# Patient Record
Sex: Male | Born: 1935 | Race: White | Hispanic: No | Marital: Married | Smoking: Former smoker
Health system: Southern US, Community
[De-identification: ages and names within clinical notes are randomized; demographics above are authoritative.]

## PROBLEM LIST (undated history)

## (undated) DIAGNOSIS — D649 Anemia, unspecified: Secondary | ICD-10-CM

## (undated) DIAGNOSIS — F419 Anxiety disorder, unspecified: Secondary | ICD-10-CM

## (undated) DIAGNOSIS — F0280 Dementia in other diseases classified elsewhere without behavioral disturbance: Secondary | ICD-10-CM

## (undated) DIAGNOSIS — E559 Vitamin D deficiency, unspecified: Secondary | ICD-10-CM

## (undated) DIAGNOSIS — G309 Alzheimer's disease, unspecified: Secondary | ICD-10-CM

## (undated) DIAGNOSIS — N189 Chronic kidney disease, unspecified: Secondary | ICD-10-CM

---

## 2007-07-20 ENCOUNTER — Ambulatory Visit: Payer: Self-pay | Admitting: Internal Medicine

## 2007-11-07 ENCOUNTER — Ambulatory Visit: Payer: Self-pay | Admitting: Emergency Medicine

## 2008-01-31 ENCOUNTER — Ambulatory Visit: Payer: Self-pay | Admitting: Family Medicine

## 2008-04-03 ENCOUNTER — Ambulatory Visit: Payer: Self-pay | Admitting: Internal Medicine

## 2008-05-15 ENCOUNTER — Ambulatory Visit: Payer: Self-pay | Admitting: Family Medicine

## 2009-01-31 ENCOUNTER — Ambulatory Visit: Payer: Self-pay | Admitting: Internal Medicine

## 2009-05-15 ENCOUNTER — Ambulatory Visit: Payer: Self-pay | Admitting: Internal Medicine

## 2009-07-14 ENCOUNTER — Ambulatory Visit: Payer: Self-pay | Admitting: Internal Medicine

## 2009-10-24 ENCOUNTER — Ambulatory Visit: Payer: Self-pay | Admitting: Internal Medicine

## 2010-02-24 ENCOUNTER — Ambulatory Visit: Payer: Self-pay | Admitting: Internal Medicine

## 2010-05-15 ENCOUNTER — Ambulatory Visit: Payer: Self-pay | Admitting: Internal Medicine

## 2010-07-12 ENCOUNTER — Ambulatory Visit: Payer: Self-pay | Admitting: Internal Medicine

## 2010-09-25 ENCOUNTER — Ambulatory Visit: Payer: Self-pay | Admitting: Family Medicine

## 2010-11-24 ENCOUNTER — Ambulatory Visit: Payer: Self-pay | Admitting: Internal Medicine

## 2011-02-01 ENCOUNTER — Ambulatory Visit: Payer: Self-pay | Admitting: Family Medicine

## 2011-08-10 ENCOUNTER — Emergency Department: Payer: Self-pay | Admitting: Unknown Physician Specialty

## 2012-08-19 ENCOUNTER — Ambulatory Visit: Payer: Self-pay

## 2013-03-23 ENCOUNTER — Ambulatory Visit: Payer: Self-pay

## 2013-03-23 LAB — CBC WITH DIFFERENTIAL/PLATELET
Basophil %: 0.5 %
Eosinophil %: 1 %
HGB: 14 g/dL (ref 13.0–18.0)
Lymphocyte #: 1.5 10*3/uL (ref 1.0–3.6)
MCH: 30.8 pg (ref 26.0–34.0)
MCV: 87 fL (ref 80–100)
Monocyte #: 0.5 x10 3/mm (ref 0.2–1.0)
Monocyte %: 6.9 %
Neutrophil #: 5.6 10*3/uL (ref 1.4–6.5)
Neutrophil %: 72.4 %
Platelet: 224 10*3/uL (ref 150–440)
RBC: 4.54 10*6/uL (ref 4.40–5.90)

## 2013-03-23 LAB — COMPREHENSIVE METABOLIC PANEL
Albumin: 4.4 g/dL (ref 3.4–5.0)
Alkaline Phosphatase: 75 U/L (ref 50–136)
Anion Gap: 7 (ref 7–16)
Bilirubin,Total: 0.6 mg/dL (ref 0.2–1.0)
Calcium, Total: 9.1 mg/dL (ref 8.5–10.1)
Co2: 31 mmol/L (ref 21–32)
EGFR (Non-African Amer.): 38 — ABNORMAL LOW
Osmolality: 289 (ref 275–301)
Potassium: 4.3 mmol/L (ref 3.5–5.1)
SGOT(AST): 15 U/L (ref 15–37)
SGPT (ALT): 15 U/L (ref 12–78)
Total Protein: 7.7 g/dL (ref 6.4–8.2)

## 2013-03-23 LAB — URINALYSIS, COMPLETE
Glucose,UR: NEGATIVE mg/dL (ref 0–75)
Nitrite: POSITIVE

## 2013-07-18 ENCOUNTER — Emergency Department: Payer: Self-pay | Admitting: Emergency Medicine

## 2013-07-18 LAB — URINALYSIS, COMPLETE
Bilirubin,UR: NEGATIVE
Nitrite: NEGATIVE
Ph: 6 (ref 4.5–8.0)
Protein: NEGATIVE
Specific Gravity: 1.012 (ref 1.003–1.030)
WBC UR: 173 /HPF (ref 0–5)

## 2013-07-18 LAB — COMPREHENSIVE METABOLIC PANEL
Albumin: 3.6 g/dL (ref 3.4–5.0)
Alkaline Phosphatase: 75 U/L (ref 50–136)
Anion Gap: 3 — ABNORMAL LOW (ref 7–16)
Bilirubin,Total: 0.4 mg/dL (ref 0.2–1.0)
Calcium, Total: 8.7 mg/dL (ref 8.5–10.1)
Chloride: 109 mmol/L — ABNORMAL HIGH (ref 98–107)
Co2: 30 mmol/L (ref 21–32)
Creatinine: 1.61 mg/dL — ABNORMAL HIGH (ref 0.60–1.30)
EGFR (Non-African Amer.): 39 — ABNORMAL LOW
Glucose: 111 mg/dL — ABNORMAL HIGH (ref 65–99)
Osmolality: 286 (ref 275–301)
SGOT(AST): 20 U/L (ref 15–37)

## 2013-07-18 LAB — CBC
HCT: 39.2 % — ABNORMAL LOW (ref 40.0–52.0)
HGB: 13.8 g/dL (ref 13.0–18.0)
MCHC: 35.1 g/dL (ref 32.0–36.0)
Platelet: 172 10*3/uL (ref 150–440)
RBC: 4.37 10*6/uL — ABNORMAL LOW (ref 4.40–5.90)
WBC: 5.6 10*3/uL (ref 3.8–10.6)

## 2013-07-20 LAB — URINE CULTURE

## 2013-09-21 ENCOUNTER — Ambulatory Visit: Payer: Self-pay

## 2013-09-21 DIAGNOSIS — R0989 Other specified symptoms and signs involving the circulatory and respiratory systems: Secondary | ICD-10-CM

## 2013-10-08 ENCOUNTER — Ambulatory Visit: Payer: Self-pay | Admitting: Family Medicine

## 2014-02-28 ENCOUNTER — Emergency Department: Payer: Self-pay | Admitting: Internal Medicine

## 2014-02-28 LAB — COMPREHENSIVE METABOLIC PANEL
ALBUMIN: 3.4 g/dL (ref 3.4–5.0)
ALT: 9 U/L — AB (ref 12–78)
AST: 13 U/L — AB (ref 15–37)
Alkaline Phosphatase: 73 U/L
Anion Gap: 6 — ABNORMAL LOW (ref 7–16)
BUN: 19 mg/dL — AB (ref 7–18)
Bilirubin,Total: 0.6 mg/dL (ref 0.2–1.0)
CREATININE: 1.42 mg/dL — AB (ref 0.60–1.30)
Calcium, Total: 8.4 mg/dL — ABNORMAL LOW (ref 8.5–10.1)
Chloride: 106 mmol/L (ref 98–107)
Co2: 28 mmol/L (ref 21–32)
EGFR (African American): 53 — ABNORMAL LOW
EGFR (Non-African Amer.): 45 — ABNORMAL LOW
Glucose: 87 mg/dL (ref 65–99)
OSMOLALITY: 281 (ref 275–301)
Potassium: 4 mmol/L (ref 3.5–5.1)
Sodium: 140 mmol/L (ref 136–145)
Total Protein: 6.3 g/dL — ABNORMAL LOW (ref 6.4–8.2)

## 2014-02-28 LAB — URINALYSIS, COMPLETE
BLOOD: NEGATIVE
Bilirubin,UR: NEGATIVE
Glucose,UR: NEGATIVE mg/dL (ref 0–75)
Hyaline Cast: 5
Ketone: NEGATIVE
Nitrite: POSITIVE
Ph: 7 (ref 4.5–8.0)
Protein: NEGATIVE
RBC,UR: 9 /HPF (ref 0–5)
SPECIFIC GRAVITY: 1.014 (ref 1.003–1.030)
SQUAMOUS EPITHELIAL: NONE SEEN

## 2014-02-28 LAB — CBC
HCT: 35.7 % — ABNORMAL LOW (ref 40.0–52.0)
HGB: 12.1 g/dL — ABNORMAL LOW (ref 13.0–18.0)
MCH: 30.9 pg (ref 26.0–34.0)
MCHC: 34 g/dL (ref 32.0–36.0)
MCV: 91 fL (ref 80–100)
Platelet: 208 10*3/uL (ref 150–440)
RBC: 3.92 10*6/uL — ABNORMAL LOW (ref 4.40–5.90)
RDW: 13.5 % (ref 11.5–14.5)
WBC: 6.2 10*3/uL (ref 3.8–10.6)

## 2014-02-28 LAB — TROPONIN I: Troponin-I: <0.02 ng/mL

## 2014-05-11 ENCOUNTER — Ambulatory Visit: Payer: Self-pay | Admitting: Obstetrics and Gynecology

## 2014-06-17 ENCOUNTER — Emergency Department: Payer: Self-pay | Admitting: Emergency Medicine

## 2014-06-17 LAB — CBC
HCT: 38.7 % — ABNORMAL LOW (ref 40.0–52.0)
HGB: 12.8 g/dL — ABNORMAL LOW (ref 13.0–18.0)
MCH: 30.3 pg (ref 26.0–34.0)
MCHC: 33.1 g/dL (ref 32.0–36.0)
MCV: 92 fL (ref 80–100)
PLATELETS: 221 10*3/uL (ref 150–440)
RBC: 4.23 10*6/uL — AB (ref 4.40–5.90)
RDW: 14.1 % (ref 11.5–14.5)
WBC: 5.9 10*3/uL (ref 3.8–10.6)

## 2014-06-17 LAB — BASIC METABOLIC PANEL
Anion Gap: 6 — ABNORMAL LOW (ref 7–16)
BUN: 19 mg/dL — AB (ref 7–18)
CHLORIDE: 109 mmol/L — AB (ref 98–107)
CO2: 27 mmol/L (ref 21–32)
Calcium, Total: 8.5 mg/dL (ref 8.5–10.1)
Creatinine: 1.49 mg/dL — ABNORMAL HIGH (ref 0.60–1.30)
EGFR (African American): 58 — ABNORMAL LOW
EGFR (Non-African Amer.): 48 — ABNORMAL LOW
GLUCOSE: 93 mg/dL (ref 65–99)
OSMOLALITY: 285 (ref 275–301)
POTASSIUM: 3.7 mmol/L (ref 3.5–5.1)
Sodium: 142 mmol/L (ref 136–145)

## 2014-06-17 LAB — URINALYSIS, COMPLETE
BLOOD: NEGATIVE
Bacteria: NONE SEEN
Bilirubin,UR: NEGATIVE
Glucose,UR: NEGATIVE mg/dL (ref 0–75)
Ketone: NEGATIVE
NITRITE: NEGATIVE
PROTEIN: NEGATIVE
Ph: 6 (ref 4.5–8.0)
RBC,UR: 7 /HPF (ref 0–5)
SPECIFIC GRAVITY: 1.019 (ref 1.003–1.030)
Squamous Epithelial: NONE SEEN
WBC UR: 63 /HPF (ref 0–5)

## 2014-06-17 LAB — TROPONIN I: Troponin-I: <0.02 ng/mL

## 2015-05-20 ENCOUNTER — Ambulatory Visit: Payer: Self-pay | Admitting: Obstetrics and Gynecology

## 2016-09-10 ENCOUNTER — Emergency Department: Payer: Medicare Other

## 2016-09-10 ENCOUNTER — Encounter: Payer: Self-pay | Admitting: Emergency Medicine

## 2016-09-10 ENCOUNTER — Emergency Department
Admission: EM | Admit: 2016-09-10 | Discharge: 2016-09-10 | Disposition: A | Discharge status: Skilled Nursing Facility | Payer: Medicare Other | Attending: Emergency Medicine | Admitting: Emergency Medicine

## 2016-09-10 DIAGNOSIS — Y939 Activity, unspecified: Secondary | ICD-10-CM | POA: Insufficient documentation

## 2016-09-10 DIAGNOSIS — R05 Cough: Secondary | ICD-10-CM | POA: Diagnosis not present

## 2016-09-10 DIAGNOSIS — S61412A Laceration without foreign body of left hand, initial encounter: Secondary | ICD-10-CM

## 2016-09-10 DIAGNOSIS — Y929 Unspecified place or not applicable: Secondary | ICD-10-CM | POA: Insufficient documentation

## 2016-09-10 DIAGNOSIS — Y999 Unspecified external cause status: Secondary | ICD-10-CM | POA: Insufficient documentation

## 2016-09-10 DIAGNOSIS — X58XXXA Exposure to other specified factors, initial encounter: Secondary | ICD-10-CM | POA: Diagnosis not present

## 2016-09-10 DIAGNOSIS — Z87891 Personal history of nicotine dependence: Secondary | ICD-10-CM | POA: Diagnosis not present

## 2016-09-10 NOTE — Discharge Instructions (Signed)
Please seek medical attention for any high fevers, chest pain, shortness of breath, change in behavior, persistent vomiting, bloody stool or any other new or concerning symptoms.  

## 2016-09-10 NOTE — ED Provider Notes (Signed)
[facility] Emergency Department Provider Note    ____________________________________________   I have reviewed the triage vital signs and the nursing notes.   HISTORY  Chief Complaint Abrasion   History limited by: Dementia   HPI Kyle Horne is a 82 y.o. male who presents to the emergency department today via EMS because of living facility staff concern for skin tear over dorsam of left hand. This occurred today. Unclear etiology. The patient himself is unable to give any history. Talked to the wife over the phone who states she saw him earlier today and other than a cough he was acting at his baseline. Patient is not complaining of any pain.   History reviewed. No pertinent past medical history.  There are no active problems to display for this patient.   History reviewed. No pertinent surgical history.  Prior to Admission medications   Not on File    Allergies Patient has no known allergies.  History reviewed. No pertinent family history.  Social History Social History  Substance Use Topics  . Smoking status: Former Games developermoker  . Smokeless tobacco: Never Used  . Alcohol use No    Review of Systems  Constitutional: Negative for fever. Cardiovascular: Negative for chest pain. Respiratory: Negative for shortness of breath. Gastrointestinal: Negative for abdominal pain, vomiting and diarrhea. Genitourinary: Negative for dysuria. Musculoskeletal: Negative for back pain. Skin: Skin tear over left hand. Neurological: Negative for headaches, focal weakness or numbness.  10-point ROS otherwise negative.  ____________________________________________   PHYSICAL EXAM:  VITAL SIGNS: ED Triage Vitals  Enc Vitals Group     BP 09/10/16 1750 (!) 141/78     Pulse Rate 09/10/16 1750 60     Resp --      Temp 09/10/16 1750 97.6 F (36.4 C)     Temp Source 09/10/16 1750 Oral     SpO2 09/10/16 1750 100 %     Weight 09/10/16 1751 164  lb (74.4 kg)   Constitutional: Awake, alert, no concerning findings. Eyes: Conjunctivae are normal. Normal extraocular movements. ENT   Head: Normocephalic and atraumatic.   Nose: No congestion/rhinnorhea.   Mouth/Throat: Mucous membranes are moist.   Neck: No stridor. Hematological/Lymphatic/Immunilogical: No cervical lymphadenopathy. Cardiovascular: Normal rate, regular rhythm.  No murmurs, rubs, or gallops.  Respiratory: Normal respiratory effort without tachypnea nor retractions. Breath sounds are clear and equal bilaterally. No wheezes/rales/rhonchi. Gastrointestinal: Soft and non tender. No rebound. No guarding.  Genitourinary: Deferred Musculoskeletal: Normal range of motion in all extremities. No lower extremity edema. Neurologic:  Not oriented. No gross focal neurologic deficits are appreciated.  Skin:  Skin is warm, dry and intact. No rash noted. Psychiatric: Mood and affect are normal. Speech and behavior are normal. Patient exhibits appropriate insight and judgment.  ____________________________________________    LABS (pertinent positives/negatives)  None  ____________________________________________   EKG  None  ____________________________________________    RADIOLOGY  CXR IMPRESSION:  No acute abnormality seen.   ____________________________________________   PROCEDURES  Procedures  ____________________________________________   INITIAL IMPRESSION / ASSESSMENT AND PLAN / ED COURSE  Pertinent labs & imaging results that were available during my care of the patient were reviewed by me and considered in my medical decision making (see chart for details).  Patient presents to the emergency department today because of concern for skin tear by living facility staff. Wife also stated he has had a cough. Wife did not feel any blood work needed to be performed. Chest x-ray was normal. Hand was  dressed.  ____________________________________________   FINAL CLINICAL IMPRESSION(S) / ED DIAGNOSES  Final diagnoses:  Skin tear of left hand without complication, initial encounter     Note: This dictation was prepared with Dragon dictation. Any transcriptional errors that result from this process are unintentional     Phineas SemenGraydon Mccrae Speciale, MD 09/10/16 1902

## 2016-09-10 NOTE — ED Notes (Signed)
RN called [facility], spoke with Marchelle FolksAmanda: Reviewed d/c instructions, follow-up care, and wound/dressing care. Caregiver verbalized understanding

## 2016-09-10 NOTE — ED Triage Notes (Signed)
[facility] for left hand skin tear. Wound not bleeding at this time.

## 2016-12-08 ENCOUNTER — Inpatient Hospital Stay
Admission: EM | Admit: 2016-12-08 | Discharge: 2016-12-12 | DRG: 194 | Disposition: A | Discharge status: Skilled Nursing Facility | Payer: Medicare Other | Attending: Specialist | Admitting: Specialist

## 2016-12-08 ENCOUNTER — Emergency Department: Payer: Medicare Other

## 2016-12-08 ENCOUNTER — Encounter: Payer: Self-pay | Admitting: Emergency Medicine

## 2016-12-08 DIAGNOSIS — F0281 Dementia in other diseases classified elsewhere with behavioral disturbance: Secondary | ICD-10-CM | POA: Diagnosis present

## 2016-12-08 DIAGNOSIS — J101 Influenza due to other identified influenza virus with other respiratory manifestations: Secondary | ICD-10-CM | POA: Diagnosis present

## 2016-12-08 DIAGNOSIS — Z87891 Personal history of nicotine dependence: Secondary | ICD-10-CM

## 2016-12-08 DIAGNOSIS — N179 Acute kidney failure, unspecified: Secondary | ICD-10-CM | POA: Diagnosis present

## 2016-12-08 DIAGNOSIS — D649 Anemia, unspecified: Secondary | ICD-10-CM | POA: Diagnosis present

## 2016-12-08 DIAGNOSIS — Y95 Nosocomial condition: Secondary | ICD-10-CM | POA: Diagnosis present

## 2016-12-08 DIAGNOSIS — N400 Enlarged prostate without lower urinary tract symptoms: Secondary | ICD-10-CM | POA: Diagnosis present

## 2016-12-08 DIAGNOSIS — J189 Pneumonia, unspecified organism: Secondary | ICD-10-CM | POA: Diagnosis present

## 2016-12-08 DIAGNOSIS — N189 Chronic kidney disease, unspecified: Secondary | ICD-10-CM | POA: Diagnosis present

## 2016-12-08 DIAGNOSIS — J111 Influenza due to unidentified influenza virus with other respiratory manifestations: Secondary | ICD-10-CM | POA: Diagnosis not present

## 2016-12-08 DIAGNOSIS — J1008 Influenza due to other identified influenza virus with other specified pneumonia: Principal | ICD-10-CM | POA: Diagnosis present

## 2016-12-08 DIAGNOSIS — J690 Pneumonitis due to inhalation of food and vomit: Secondary | ICD-10-CM | POA: Diagnosis present

## 2016-12-08 DIAGNOSIS — E860 Dehydration: Secondary | ICD-10-CM | POA: Diagnosis present

## 2016-12-08 DIAGNOSIS — E559 Vitamin D deficiency, unspecified: Secondary | ICD-10-CM | POA: Diagnosis present

## 2016-12-08 DIAGNOSIS — Z79899 Other long term (current) drug therapy: Secondary | ICD-10-CM

## 2016-12-08 DIAGNOSIS — G309 Alzheimer's disease, unspecified: Secondary | ICD-10-CM | POA: Diagnosis present

## 2016-12-08 DIAGNOSIS — J181 Lobar pneumonia, unspecified organism: Secondary | ICD-10-CM

## 2016-12-08 DIAGNOSIS — F419 Anxiety disorder, unspecified: Secondary | ICD-10-CM | POA: Diagnosis present

## 2016-12-08 DIAGNOSIS — R0682 Tachypnea, not elsewhere classified: Secondary | ICD-10-CM | POA: Diagnosis present

## 2016-12-08 DIAGNOSIS — R509 Fever, unspecified: Secondary | ICD-10-CM

## 2016-12-08 HISTORY — DX: Anemia, unspecified: D64.9

## 2016-12-08 HISTORY — DX: Anxiety disorder, unspecified: F41.9

## 2016-12-08 HISTORY — DX: Alzheimer's disease, unspecified: G30.9

## 2016-12-08 HISTORY — DX: Vitamin D deficiency, unspecified: E55.9

## 2016-12-08 HISTORY — DX: Chronic kidney disease, unspecified: N18.9

## 2016-12-08 HISTORY — DX: Dementia in other diseases classified elsewhere, unspecified severity, without behavioral disturbance, psychotic disturbance, mood disturbance, and anxiety: F02.80

## 2016-12-08 LAB — CBC WITH DIFFERENTIAL/PLATELET
Basophils Absolute: 0.1 10*3/uL (ref 0–0.1)
Basophils Relative: 1 %
EOS PCT: 0 %
Eosinophils Absolute: 0 10*3/uL (ref 0–0.7)
HCT: 34.7 % — ABNORMAL LOW (ref 40.0–52.0)
HEMOGLOBIN: 12.2 g/dL — AB (ref 13.0–18.0)
LYMPHS ABS: 0.5 10*3/uL — AB (ref 1.0–3.6)
LYMPHS PCT: 6 %
MCH: 31.6 pg (ref 26.0–34.0)
MCHC: 35.3 g/dL (ref 32.0–36.0)
MCV: 89.7 fL (ref 80.0–100.0)
MONOS PCT: 6 %
Monocytes Absolute: 0.6 10*3/uL (ref 0.2–1.0)
Neutro Abs: 7.9 10*3/uL — ABNORMAL HIGH (ref 1.4–6.5)
Neutrophils Relative %: 87 %
Platelets: 158 10*3/uL (ref 150–440)
RBC: 3.86 MIL/uL — AB (ref 4.40–5.90)
RDW: 13.7 % (ref 11.5–14.5)
WBC: 9.1 10*3/uL (ref 3.8–10.6)

## 2016-12-08 LAB — COMPREHENSIVE METABOLIC PANEL
ALK PHOS: 53 U/L (ref 38–126)
ALT: 26 U/L (ref 17–63)
AST: 34 U/L (ref 15–41)
Albumin: 3.7 g/dL (ref 3.5–5.0)
Anion gap: 9 (ref 5–15)
BUN: 29 mg/dL — ABNORMAL HIGH (ref 6–20)
CO2: 24 mmol/L (ref 22–32)
CREATININE: 1.5 mg/dL — AB (ref 0.61–1.24)
Calcium: 8.5 mg/dL — ABNORMAL LOW (ref 8.9–10.3)
Chloride: 109 mmol/L (ref 101–111)
GFR, EST AFRICAN AMERICAN: 47 mL/min — AB (ref >60)
GFR, EST NON AFRICAN AMERICAN: 40 mL/min — AB (ref >60)
Glucose, Bld: 124 mg/dL — ABNORMAL HIGH (ref 65–99)
Potassium: 3.8 mmol/L (ref 3.5–5.1)
Sodium: 142 mmol/L (ref 135–145)
TOTAL PROTEIN: 7.1 g/dL (ref 6.5–8.1)
Total Bilirubin: 0.9 mg/dL (ref 0.3–1.2)

## 2016-12-08 LAB — URINALYSIS, ROUTINE W REFLEX MICROSCOPIC
Bilirubin Urine: NEGATIVE
Glucose, UA: NEGATIVE mg/dL
HGB URINE DIPSTICK: NEGATIVE
Ketones, ur: NEGATIVE mg/dL
LEUKOCYTES UA: NEGATIVE
Nitrite: NEGATIVE
PROTEIN: NEGATIVE mg/dL
SPECIFIC GRAVITY, URINE: 1.01 (ref 1.005–1.030)
pH: 6 (ref 5.0–8.0)

## 2016-12-08 LAB — INFLUENZA PANEL BY PCR (TYPE A & B)
INFLAPCR: NEGATIVE
INFLBPCR: POSITIVE — AB

## 2016-12-08 LAB — TROPONIN I: Troponin I: <0.03 ng/mL (ref <0.03)

## 2016-12-08 LAB — PROCALCITONIN

## 2016-12-08 LAB — PROTIME-INR
INR: 1.13
Prothrombin Time: 14.6 seconds (ref 11.4–15.2)

## 2016-12-08 LAB — LIPASE, BLOOD: LIPASE: 17 U/L (ref 11–51)

## 2016-12-08 LAB — LACTIC ACID, PLASMA: Lactic Acid, Venous: 1.3 mmol/L (ref 0.5–1.9)

## 2016-12-08 MED ORDER — AZITHROMYCIN 500 MG IV SOLR
500.0000 mg | Freq: Once | INTRAVENOUS | Status: AC
  Administered 2016-12-08: 500 mg via INTRAVENOUS
  Filled 2016-12-08: qty 500

## 2016-12-08 MED ORDER — IPRATROPIUM-ALBUTEROL 0.5-2.5 (3) MG/3ML IN SOLN
3.0000 mL | Freq: Once | RESPIRATORY_TRACT | Status: AC
  Administered 2016-12-08: 3 mL via RESPIRATORY_TRACT
  Filled 2016-12-08: qty 3

## 2016-12-08 MED ORDER — SODIUM CHLORIDE 0.9 % IV BOLUS (SEPSIS)
1000.0000 mL | Freq: Once | INTRAVENOUS | Status: AC
  Administered 2016-12-08: 1000 mL via INTRAVENOUS

## 2016-12-08 MED ORDER — CEFTRIAXONE SODIUM-DEXTROSE 1-3.74 GM-% IV SOLR
1.0000 g | Freq: Once | INTRAVENOUS | Status: AC
  Administered 2016-12-08: 1 g via INTRAVENOUS
  Filled 2016-12-08: qty 50

## 2016-12-08 MED ORDER — IBUPROFEN 100 MG/5ML PO SUSP
ORAL | Status: AC
  Administered 2016-12-08: 400 mg via ORAL
  Filled 2016-12-08: qty 20

## 2016-12-08 MED ORDER — ACETAMINOPHEN 160 MG/5ML PO SUSP
ORAL | Status: AC
  Administered 2016-12-08: 1000 mg via ORAL
  Filled 2016-12-08: qty 35

## 2016-12-08 MED ORDER — CEFTRIAXONE SODIUM 1 G IJ SOLR: 1.0000 g | Freq: Once | INTRAMUSCULAR | Status: DC

## 2016-12-08 MED ORDER — IBUPROFEN 100 MG/5ML PO SUSP
400.0000 mg | Freq: Once | ORAL | Status: AC
  Administered 2016-12-08: 400 mg via ORAL

## 2016-12-08 MED ORDER — OSELTAMIVIR PHOSPHATE 30 MG PO CAPS
30.0000 mg | ORAL_CAPSULE | Freq: Once | ORAL | Status: AC
  Administered 2016-12-08: 30 mg via ORAL
  Filled 2016-12-08: qty 1

## 2016-12-08 MED ORDER — ACETAMINOPHEN 160 MG/5ML PO SOLN
1000.0000 mg | Freq: Once | ORAL | Status: AC
  Administered 2016-12-08: 1000 mg via ORAL
  Filled 2016-12-08: qty 40

## 2016-12-08 MED ORDER — CLINDAMYCIN PHOSPHATE 900 MG/50ML IV SOLN
900.0000 mg | Freq: Once | INTRAVENOUS | Status: AC
  Administered 2016-12-08: 900 mg via INTRAVENOUS
  Filled 2016-12-08: qty 50

## 2016-12-08 NOTE — ED Triage Notes (Signed)
[facility] to ED via [facility], per facility pt has increased weakness and decrease PO intake x3days. Pt hx of dementia. Pt fever 101 HR 80, 96% on RA, 146/78. Per EMS pt is  Incontinent.

## 2016-12-08 NOTE — ED Notes (Signed)
Pt family left requesting sitter. Charge nurse made aware. RN at bedside at this time to monitor pt.

## 2016-12-08 NOTE — ED Provider Notes (Signed)
[facility] Emergency Department Provider Note  ____________________________________________   None    (approximate)  I have reviewed the triage vital signs and the nursing notes.   HISTORY  Chief Complaint Weakness and Fever    HPI Kyle Horne is a 83 y.o. male who comes to the emergency department via EMS for fever and worsening altered mental status today. He resides in a nursing home in his diaper dependent. According to EMS he was febrile to 102 en route. He is full code. He has a history of Alzheimer's dementia. He is also had a slightly productive cough for the past several days. Further history is limited by the patient's dementia.   Past Medical History:  Diagnosis Date  . Alzheimer disease   . Anemia   . Anxiety disorder   . CKD (chronic kidney disease)   . Vitamin D deficiency     Patient Active Problem List   Diagnosis Date Noted  . HCAP (healthcare-associated pneumonia) 12/09/2016    History reviewed. No pertinent surgical history.  Prior to Admission medications   Medication Sig Start Date End Date Taking? Authorizing Provider  acetaminophen (TYLENOL) 500 MG tablet Take 500 mg by mouth 3 (three) times daily.    Yes Historical Provider, MD  ALPRAZolam Prudy Feeler) 0.5 MG tablet Take 0.5 mg by mouth at bedtime.   Yes Historical Provider, MD  divalproex (DEPAKOTE SPRINKLE) 125 MG capsule Take 250 mg by mouth 3 (three) times daily.   Yes Historical Provider, MD  finasteride (PROSCAR) 5 MG tablet Take 5 mg by mouth daily.   Yes Historical Provider, MD  furosemide (LASIX) 40 MG tablet Take 40 mg by mouth daily.   Yes Historical Provider, MD  Melatonin 5 MG TABS Take 1 tablet by mouth at bedtime.   Yes Historical Provider, MD  potassium chloride (MICRO-K) 10 MEQ CR capsule Take 20 mEq by mouth daily.   Yes Historical Provider, MD  QUEtiapine (SEROQUEL) 50 MG tablet Take 50 mg by mouth at bedtime.   Yes Historical Provider, MD    vitamin B-12 (CYANOCOBALAMIN) 1000 MCG tablet Take 1,000 mcg by mouth daily.   Yes Historical Provider, MD    Allergies Patient has no known allergies.  No family history on file.  Social History Social History  Substance Use Topics  . Smoking status: Former Games developer  . Smokeless tobacco: Never Used  . Alcohol use No    Review of Systems Level V exemption history is limited by the patient's dementia 10-point ROS otherwise negative.  ____________________________________________   PHYSICAL EXAM:  VITAL SIGNS: ED Triage Vitals  Enc Vitals Group     BP      Pulse      Resp      Temp      Temp src      SpO2      Weight      Height      Head Circumference      Peak Flow      Pain Score      Pain Loc      Pain Edu?      Excl. in GC?     Constitutional: Pleasant. No diaphoresis. Elevated respiratory rate with a white sounding cough Eyes: PERRL EOMI. Head: Atraumatic. Nose: No congestion/rhinnorhea. Mouth/Throat: No trismus Neck: No stridor.   Cardiovascular: Normal rate, regular rhythm. Grossly normal heart sounds.  Good peripheral circulation. Respiratory: Increased respiratory effort.  No retractions. Lungs CTAB and moving good air  Gastrointestinal: Soft nondistended nontender no rebound no guarding no peritonitis no McBurney's tenderness negative Rovsing's no costovertebral tenderness negative Murphy's diapered dependent Musculoskeletal: No lower extremity edema   Neurologic:  Slow methodical speech No gross focal neurologic deficits are appreciated. Skin:  Skin is warm, dry and intact. No rash noted.     ____________________________________________   DIFFERENTIAL  Urinary tract infection, metabolic arrangement, influenza, pneumonia, sepsis ____________________________________________   LABS (all labs ordered are listed, but only abnormal results are displayed)  Labs Reviewed  URINALYSIS, ROUTINE W REFLEX MICROSCOPIC - Abnormal; Notable for the  following:       Result Value   Color, Urine YELLOW (*)    APPearance CLEAR (*)    All other components within normal limits  INFLUENZA PANEL BY PCR (TYPE A & B) - Abnormal; Notable for the following:    Influenza B By PCR POSITIVE (*)    All other components within normal limits  CBC WITH DIFFERENTIAL/PLATELET - Abnormal; Notable for the following:    RBC 3.86 (*)    Hemoglobin 12.2 (*)    HCT 34.7 (*)    Neutro Abs 7.9 (*)    Lymphs Abs 0.5 (*)    All other components within normal limits  COMPREHENSIVE METABOLIC PANEL - Abnormal; Notable for the following:    Glucose, Bld 124 (*)    BUN 29 (*)    Creatinine, Ser 1.50 (*)    Calcium 8.5 (*)    GFR calc non Af Amer 40 (*)    GFR calc Af Amer 47 (*)    All other components within normal limits  CULTURE, BLOOD (ROUTINE X 2)  CULTURE, BLOOD (ROUTINE X 2)  MRSA PCR SCREENING  URINE CULTURE  LACTIC ACID, PLASMA  LIPASE, BLOOD  PROCALCITONIN  PROTIME-INR  TROPONIN I  TSH  CBC WITH DIFFERENTIAL/PLATELET  HEMOGLOBIN A1C    Influenza B-positive urinalysis not consistent with infection __________________________________________  EKG   ____________________________________________  RADIOLOGY  Chest x-ray concerning for left lower lobe pneumonia ____________________________________________   PROCEDURES  Procedure(s) performed: no  Procedures  Critical Care performed: yes  CRITICAL CARE Performed by: Merrily Brittle   Total critical care time: 32 minutes  Critical care time was exclusive of separately billable procedures and treating other patients.  Critical care was necessary to treat or prevent imminent or life-threatening deterioration.  Critical care was time spent personally by me on the following activities: development of treatment plan with patient and/or surrogate as well as nursing, discussions with consultants, evaluation of patient's response to treatment, examination of patient, obtaining history  from patient or surrogate, ordering and performing treatments and interventions, ordering and review of laboratory studies, ordering and review of radiographic studies, pulse oximetry and re-evaluation of patient's condition.   ____________________________________________   INITIAL IMPRESSION / ASSESSMENT AND PLAN / ED COURSE  Pertinent labs & imaging results that were available during my care of the patient were reviewed by me and considered in my medical decision making (see chart for details).  The patient arrives hemodynamically stable although with an axillary temperature of 101.4. Per report he has foul-smelling urine. I will begin with 1 L of crystalloid resuscitation along with broad cultures and x-ray and urinalysis. 1 g of ceftriaxone for now. Code sepsis called.      ____________________________________________   FINAL CLINICAL IMPRESSION(S) / ED DIAGNOSES  Final diagnoses:  Alzheimer's dementia with behavioral disturbance, unspecified timing of dementia onset  Fever, unspecified fever cause  Community acquired pneumonia of left  lower lobe of lung (HCC)  Influenza      NEW MEDICATIONS STARTED DURING THIS VISIT:  Current Discharge Medication List       Note:  This document was prepared using Dragon voice recognition software and may include unintentional dictation errors.     Merrily BrittleNeil Clessie Karras, MD 12/09/16 [id number]

## 2016-12-09 ENCOUNTER — Encounter: Payer: Self-pay | Admitting: Internal Medicine

## 2016-12-09 DIAGNOSIS — N400 Enlarged prostate without lower urinary tract symptoms: Secondary | ICD-10-CM | POA: Diagnosis present

## 2016-12-09 DIAGNOSIS — D649 Anemia, unspecified: Secondary | ICD-10-CM | POA: Diagnosis present

## 2016-12-09 DIAGNOSIS — G309 Alzheimer's disease, unspecified: Secondary | ICD-10-CM | POA: Diagnosis present

## 2016-12-09 DIAGNOSIS — F419 Anxiety disorder, unspecified: Secondary | ICD-10-CM | POA: Diagnosis present

## 2016-12-09 DIAGNOSIS — Z87891 Personal history of nicotine dependence: Secondary | ICD-10-CM | POA: Diagnosis not present

## 2016-12-09 DIAGNOSIS — N189 Chronic kidney disease, unspecified: Secondary | ICD-10-CM | POA: Diagnosis present

## 2016-12-09 DIAGNOSIS — N179 Acute kidney failure, unspecified: Secondary | ICD-10-CM | POA: Diagnosis present

## 2016-12-09 DIAGNOSIS — E860 Dehydration: Secondary | ICD-10-CM | POA: Diagnosis present

## 2016-12-09 DIAGNOSIS — Y95 Nosocomial condition: Secondary | ICD-10-CM | POA: Diagnosis present

## 2016-12-09 DIAGNOSIS — J101 Influenza due to other identified influenza virus with other respiratory manifestations: Secondary | ICD-10-CM | POA: Diagnosis present

## 2016-12-09 DIAGNOSIS — J111 Influenza due to unidentified influenza virus with other respiratory manifestations: Secondary | ICD-10-CM | POA: Diagnosis present

## 2016-12-09 DIAGNOSIS — Z79899 Other long term (current) drug therapy: Secondary | ICD-10-CM | POA: Diagnosis not present

## 2016-12-09 DIAGNOSIS — F0281 Dementia in other diseases classified elsewhere with behavioral disturbance: Secondary | ICD-10-CM | POA: Diagnosis present

## 2016-12-09 DIAGNOSIS — E559 Vitamin D deficiency, unspecified: Secondary | ICD-10-CM | POA: Diagnosis present

## 2016-12-09 DIAGNOSIS — J690 Pneumonitis due to inhalation of food and vomit: Secondary | ICD-10-CM | POA: Diagnosis present

## 2016-12-09 DIAGNOSIS — J189 Pneumonia, unspecified organism: Secondary | ICD-10-CM | POA: Diagnosis present

## 2016-12-09 DIAGNOSIS — J1008 Influenza due to other identified influenza virus with other specified pneumonia: Secondary | ICD-10-CM | POA: Diagnosis present

## 2016-12-09 DIAGNOSIS — R0682 Tachypnea, not elsewhere classified: Secondary | ICD-10-CM | POA: Diagnosis present

## 2016-12-09 LAB — MRSA PCR SCREENING: MRSA by PCR: NEGATIVE

## 2016-12-09 LAB — TSH: TSH: 3.228 u[IU]/mL (ref 0.350–4.500)

## 2016-12-09 MED ORDER — ALPRAZOLAM 0.5 MG PO TABS
0.5000 mg | ORAL_TABLET | Freq: Every day | ORAL | Status: DC
  Administered 2016-12-09 – 2016-12-11 (×4): 0.5 mg via ORAL
  Filled 2016-12-09 (×4): qty 1

## 2016-12-09 MED ORDER — SODIUM CHLORIDE 0.9% FLUSH
3.0000 mL | Freq: Two times a day (BID) | INTRAVENOUS | Status: DC
  Administered 2016-12-09 – 2016-12-12 (×6): 3 mL via INTRAVENOUS

## 2016-12-09 MED ORDER — FUROSEMIDE 40 MG PO TABS
40.0000 mg | ORAL_TABLET | Freq: Every day | ORAL | Status: DC
  Administered 2016-12-09 – 2016-12-12 (×4): 40 mg via ORAL
  Filled 2016-12-09 (×4): qty 1

## 2016-12-09 MED ORDER — POTASSIUM CHLORIDE CRYS ER 20 MEQ PO TBCR
20.0000 meq | EXTENDED_RELEASE_TABLET | Freq: Every day | ORAL | Status: DC
  Administered 2016-12-09 – 2016-12-10 (×2): 20 meq via ORAL
  Filled 2016-12-09 (×3): qty 1

## 2016-12-09 MED ORDER — VITAMIN B-12 1000 MCG PO TABS
1000.0000 ug | ORAL_TABLET | Freq: Every day | ORAL | Status: DC
Start: 2016-12-09 — End: 2016-12-12
  Administered 2016-12-09 – 2016-12-12 (×4): 1000 ug via ORAL
  Filled 2016-12-09 (×4): qty 1

## 2016-12-09 MED ORDER — PIPERACILLIN-TAZOBACTAM 3.375 G IVPB
3.3750 g | Freq: Three times a day (TID) | INTRAVENOUS | Status: DC
  Administered 2016-12-09 – 2016-12-11 (×8): 3.375 g via INTRAVENOUS
  Filled 2016-12-09 (×8): qty 50

## 2016-12-09 MED ORDER — DOCUSATE SODIUM 100 MG PO CAPS
100.0000 mg | ORAL_CAPSULE | Freq: Two times a day (BID) | ORAL | Status: DC
  Administered 2016-12-09 – 2016-12-12 (×7): 100 mg via ORAL
  Filled 2016-12-09 (×7): qty 1

## 2016-12-09 MED ORDER — MELATONIN 5 MG PO TABS
1.0000 | ORAL_TABLET | Freq: Every day | ORAL | Status: DC
  Administered 2016-12-09 – 2016-12-11 (×4): 5 mg via ORAL
  Filled 2016-12-09 (×5): qty 1

## 2016-12-09 MED ORDER — HEPARIN SODIUM (PORCINE) 5000 UNIT/ML IJ SOLN
5000.0000 [IU] | Freq: Three times a day (TID) | INTRAMUSCULAR | Status: DC
  Administered 2016-12-09 – 2016-12-12 (×10): 5000 [IU] via SUBCUTANEOUS
  Filled 2016-12-09 (×10): qty 1

## 2016-12-09 MED ORDER — OSELTAMIVIR PHOSPHATE 75 MG PO CAPS: 75.0000 mg | ORAL_CAPSULE | Freq: Two times a day (BID) | ORAL | Status: DC

## 2016-12-09 MED ORDER — FINASTERIDE 5 MG PO TABS
5.0000 mg | ORAL_TABLET | Freq: Every day | ORAL | Status: DC
  Administered 2016-12-09 – 2016-12-12 (×4): 5 mg via ORAL
  Filled 2016-12-09 (×4): qty 1

## 2016-12-09 MED ORDER — QUETIAPINE FUMARATE 25 MG PO TABS
50.0000 mg | ORAL_TABLET | Freq: Every day | ORAL | Status: DC
  Administered 2016-12-09 – 2016-12-11 (×4): 50 mg via ORAL
  Filled 2016-12-09 (×4): qty 2

## 2016-12-09 MED ORDER — ACETAMINOPHEN 650 MG RE SUPP: 650.0000 mg | Freq: Four times a day (QID) | RECTAL | Status: DC | PRN

## 2016-12-09 MED ORDER — DIVALPROEX SODIUM 125 MG PO CSDR
250.0000 mg | DELAYED_RELEASE_CAPSULE | Freq: Three times a day (TID) | ORAL | Status: DC
Start: 2016-12-09 — End: 2016-12-12
  Administered 2016-12-09 – 2016-12-12 (×10): 250 mg via ORAL
  Filled 2016-12-09 (×12): qty 2

## 2016-12-09 MED ORDER — OSELTAMIVIR PHOSPHATE 30 MG PO CAPS
30.0000 mg | ORAL_CAPSULE | Freq: Two times a day (BID) | ORAL | Status: DC
  Administered 2016-12-09 – 2016-12-12 (×8): 30 mg via ORAL
  Filled 2016-12-09 (×8): qty 1

## 2016-12-09 MED ORDER — ONDANSETRON HCL 4 MG PO TABS: 4.0000 mg | ORAL_TABLET | Freq: Four times a day (QID) | ORAL | Status: DC | PRN

## 2016-12-09 MED ORDER — ACETAMINOPHEN 325 MG PO TABS: 650.0000 mg | ORAL_TABLET | Freq: Four times a day (QID) | ORAL | Status: DC | PRN

## 2016-12-09 MED ORDER — ONDANSETRON HCL 4 MG/2ML IJ SOLN
4.0000 mg | Freq: Four times a day (QID) | INTRAMUSCULAR | Status: DC | PRN
Start: 2016-12-09 — End: 2016-12-12

## 2016-12-09 MED ORDER — SODIUM CHLORIDE 0.9 % IV SOLN
INTRAVENOUS | Status: DC
  Administered 2016-12-09: 75 mL/h via INTRAVENOUS
  Administered 2016-12-09: 125 mL/h via INTRAVENOUS
  Administered 2016-12-09 – 2016-12-12 (×4): via INTRAVENOUS

## 2016-12-09 NOTE — Progress Notes (Signed)
Pharmacy Antibiotic Note  Kyle FisherDonald Bryan Horne is a 83 y.o. male admitted on 12/08/2016 with pneumonia.  Pharmacy has been consulted for Zosyn dosing.  Plan: Zosyn 3.375g IV q8h (4 hour infusion).  Height: 6' (182.9 cm) Weight: 171 lb 4.8 oz (77.7 kg) IBW/kg (Calculated) : 77.6  Temp (24hrs), Avg:99.4 F (37.4 C), Min:97.5 F (36.4 C), Max:101.4 F (38.6 C)   Recent Labs Lab 12/08/16 2022  WBC 9.1  CREATININE 1.50*  LATICACIDVEN 1.3    Estimated Creatinine Clearance: 38.8 mL/min (A) (by C-G formula based on SCr of 1.5 mg/dL (H)).    No Known Allergies  Antimicrobials this admission: Azithromycin, ceftriaxone, clindamycin 3/23 >> Zosyn 3/24   >>   Dose adjustments this admission:   Microbiology results: 3/23 BCx: pendign 3/23 UCx: pending  3/24 MRSA PCR: pending      3/23 CXR: aspiration vs pneumonia  Thank you for allowing pharmacy to be a part of this patient's care.  Bradee Common S 12/09/2016 1:38 AM

## 2016-12-09 NOTE — Progress Notes (Signed)
[employer] [facility] [place] [facility]   PATIENT NAME: Kyle Horne    MR#:  [id number]  DATE OF BIRTH:  August 08, 1928  SUBJECTIVE:   Pt. Here due to fever and noted to have the Flu.  Pt' has baseline dementia and poor historian. Patient's by mouth intake is poor  REVIEW OF SYSTEMS:    Review of Systems  Unable to perform ROS: Dementia    Nutrition: Regular Tolerating Diet: Yes but poor Tolerating PT: await Eval.    DRUG ALLERGIES:  No Known Allergies  VITALS:  Blood pressure (!) 127/53, pulse (!) 55, temperature 97.3 F (36.3 C), temperature source Oral, resp. rate 18, height 6' (1.829 m), weight 77.7 kg (171 lb 4.8 oz), SpO2 94 %.  PHYSICAL EXAMINATION:   Physical Exam  GENERAL:  83 y.o.-year-old patient lying in the bed in NAD.  EYES: Pupils equal, round, reactive to light and accommodation. No scleral icterus. Extraocular muscles intact.  HEENT: Head atraumatic, normocephalic. Oropharynx and nasopharynx clear.  NECK:  Supple, no jugular venous distention. No thyroid enlargement, no tenderness.  LUNGS: Normal breath sounds bilaterally, no wheezing, rales, rhonchi. No use of accessory muscles of respiration.  CARDIOVASCULAR: S1, S2 normal. No murmurs, rubs, or gallops.  ABDOMEN: Soft, nontender, nondistended. Bowel sounds present. No organomegaly or mass.  EXTREMITIES: No cyanosis, clubbing or edema b/l.    NEUROLOGIC: Cranial nerves II through XII are intact. No focal Motor or sensory deficits b/l. Globally weak.    PSYCHIATRIC: The patient is alert and oriented x 1.   SKIN: No obvious rash, lesion, or ulcer.    LABORATORY PANEL:   CBC  Recent Labs Lab 12/08/16 2022  WBC 9.1  HGB 12.2*  HCT 34.7*  PLT 158   ------------------------------------------------------------------------------------------------------------------  Chemistries   Recent Labs Lab 12/08/16 2022  NA 142  K 3.8  CL 109  CO2 24  GLUCOSE 124*  BUN 29*  CREATININE  1.50*  CALCIUM 8.5*  AST 34  ALT 26  ALKPHOS 53  BILITOT 0.9   ------------------------------------------------------------------------------------------------------------------  Cardiac Enzymes  Recent Labs Lab 12/08/16 2022  TROPONINI <0.03   ------------------------------------------------------------------------------------------------------------------  RADIOLOGY:  Dg Chest Port 1 View  Result Date: 12/08/2016 CLINICAL DATA:  Increase weakness and decreased oral intake for 3 days. Fever. Sepsis. EXAM: PORTABLE CHEST 1 VIEW COMPARISON:  09/10/2016 and 06/17/2014. FINDINGS: 2021 hours. Positioning is limited by the patient's thoracic kyphosis and dementia. The mandible overlies the lung apices. Left subclavian pacemaker leads appear unchanged within the right atrium and right ventricle. The heart size and mediastinal contours are stable. Mildly increased density has developed at the left lung base. Right basilar atelectasis or scarring appears unchanged. There is no pleural effusion or pneumothorax. No acute osseous findings are seen. IMPRESSION: Interval mildly increased density at the left lung base, potentially sequela of aspiration or early pneumonia. No other significant changes. Electronically Signed   By: Carey BullocksWilliam  Veazey M.D.   On: 12/08/2016 21:01     ASSESSMENT AND PLAN:   83 year old male with past medical history of dementia, chronic kidney disease, anxiety, presented to the hospital due to fever and noted to be positive for the flu.  1. Flu-patient was positive for influenza B by PCR. -Continue droplet precautions, Tamiflu.  2. Pneumonia-patient's chest x-ray admission was suggestive of a left-sided pneumonia. Suspect to be aspiration in nature. -Continue IV Zosyn, we'll get speech evaluation.  3. BPH-continue finasteride.  4. Anxiety-continue Xanax.  5. Dementia would be able disturbance-continue Depakote, Seroquel.  6. AKI - cont. Gentle IV fluids and follow  BUN/Cr.  - due to dehydration and poor PO intake.   All the records are reviewed and case discussed with Care Management/Social Worker. Management plans discussed with the patient, family and they are in agreement.  CODE STATUS: Full code  DVT Prophylaxis: hep. SQ  TOTAL TIME TAKING CARE OF THIS PATIENT: 30 minutes.   POSSIBLE D/C IN 1-2 DAYS, DEPENDING ON CLINICAL CONDITION.   Houston Siren M.D on 12/09/2016 at 1:25 PM  Between 7am to 6pm - Pager - [phone]  After 6pm go to [email or web address] - Scientist, research (life sciences) [facility]  Office  [phone]  CC: Primary care physician; No PCP Per Patient

## 2016-12-09 NOTE — Progress Notes (Signed)
New Admission Note:   Arrival Method: per stretcher from ED, pt came from [facility] Mental Orientation: alert and oriented to self, disoriented to place, time and situation Telemetry: initially had telemetry order but per Dr. Sheryle Hailiamond, pt can be off tele (no box available). Order for telemetry discontinued per verbal order. Assessment: Completed Skin: warm, dry with scattered bruises noted on both arms and left knee. No preexisting wound/sores noted. Prophylactic sacral foam applied. IV: G20 on the left and right forearm, both with transparent dressing, intact and wrapped with gauze (pt is confused, tries to pull them out) Pain: PAINAD score=0 Safety Measures: pt is confused, bedrails up, bed alarm on, pt placed in room near the nurse's station, yellow socks on Admission: incomplete, pt is confused, no family member present at bedside this time 1A Orientation: n/a, pt is confused  Orders have been reviewed and implemented. Will continue to monitor patient. Call light has been placed within reach and bed alarm has been activated.   Janice NorrieAnessa Twan Harkin BSN, RN [facility] 1A

## 2016-12-09 NOTE — H&P (Signed)
Kyle Horne is an 83 y.o. male.   Chief Complaint: Fever HPI: The patient with past medical history of dementia as well as chronic kidney disease presents to emergency department from his nursing home with fever. Maximum temperature 101.28F upon arrival. The patient states that he has felt very fatigued and worn out. He cannot put her finger on what is bothering him. Be on this the patient is not able to provide significant medical history. Chest x-ray in the emergency department revealed a possible left lower lobe pneumonia. Laboratory evaluation was also positive for influenza infection. Oxygen saturations were 89% on room air. She says emergency department staff called the hospitalist service for admission.  Past Medical History:  Diagnosis Date  . Alzheimer disease   . Anemia   . Anxiety disorder   . CKD (chronic kidney disease)   . Vitamin D deficiency     History reviewed. No pertinent surgical history. Cannot recall due to dementia  No family history on file. Cannot contribute due to dementia Social History:  reports that he has quit smoking. He has never used smokeless tobacco. He reports that he does not drink alcohol or use drugs.  Allergies: No Known Allergies  Medications Prior to Admission  Medication Sig Dispense Refill  . acetaminophen (TYLENOL) 500 MG tablet Take 500 mg by mouth 3 (three) times daily.     Marland Kitchen ALPRAZolam (XANAX) 0.5 MG tablet Take 0.5 mg by mouth at bedtime.    . divalproex (DEPAKOTE SPRINKLE) 125 MG capsule Take 250 mg by mouth 3 (three) times daily.    . finasteride (PROSCAR) 5 MG tablet Take 5 mg by mouth daily.    . furosemide (LASIX) 40 MG tablet Take 40 mg by mouth daily.    . Melatonin 5 MG TABS Take 1 tablet by mouth at bedtime.    . potassium chloride (MICRO-K) 10 MEQ CR capsule Take 20 mEq by mouth daily.    . QUEtiapine (SEROQUEL) 50 MG tablet Take 50 mg by mouth at bedtime.    . vitamin B-12 (CYANOCOBALAMIN) 1000 MCG tablet Take 1,000  mcg by mouth daily.      Results for orders placed or performed during the hospital encounter of 12/08/16 (from the past 48 hour(s))  Lactic acid, plasma     Status: None   Collection Time: 12/08/16  8:22 PM  Result Value Ref Range   Lactic Acid, Venous 1.3 0.5 - 1.9 mmol/L  Urinalysis, Routine w reflex microscopic     Status: Abnormal   Collection Time: 12/08/16  8:22 PM  Result Value Ref Range   Color, Urine YELLOW (A) YELLOW   APPearance CLEAR (A) CLEAR   Specific Gravity, Urine 1.010 1.005 - 1.030   pH 6.0 5.0 - 8.0   Glucose, UA NEGATIVE NEGATIVE mg/dL   Hgb urine dipstick NEGATIVE NEGATIVE   Bilirubin Urine NEGATIVE NEGATIVE   Ketones, ur NEGATIVE NEGATIVE mg/dL   Protein, ur NEGATIVE NEGATIVE mg/dL   Nitrite NEGATIVE NEGATIVE   Leukocytes, UA NEGATIVE NEGATIVE  Influenza panel by PCR (type A & B)     Status: Abnormal   Collection Time: 12/08/16  8:22 PM  Result Value Ref Range   Influenza A By PCR NEGATIVE NEGATIVE   Influenza B By PCR POSITIVE (A) NEGATIVE    Comment: (NOTE) The Xpert Xpress Flu assay is intended as an aid in the diagnosis of  influenza and should not be used as a sole basis for treatment.  This  assay is  FDA approved for nasopharyngeal swab specimens only. Nasal  washings and aspirates are unacceptable for Xpert Xpress Flu testing.   CBC with Differential/Platelet     Status: Abnormal   Collection Time: 12/08/16  8:22 PM  Result Value Ref Range   WBC 9.1 3.8 - 10.6 K/uL   RBC 3.86 (L) 4.40 - 5.90 MIL/uL   Hemoglobin 12.2 (L) 13.0 - 18.0 g/dL   HCT 34.7 (L) 40.0 - 52.0 %   MCV 89.7 80.0 - 100.0 fL   MCH 31.6 26.0 - 34.0 pg   MCHC 35.3 32.0 - 36.0 g/dL   RDW 13.7 11.5 - 14.5 %   Platelets 158 150 - 440 K/uL   Neutrophils Relative % 87 %   Neutro Abs 7.9 (H) 1.4 - 6.5 K/uL   Lymphocytes Relative 6 %   Lymphs Abs 0.5 (L) 1.0 - 3.6 K/uL   Monocytes Relative 6 %   Monocytes Absolute 0.6 0.2 - 1.0 K/uL   Eosinophils Relative 0 %   Eosinophils  Absolute 0.0 0 - 0.7 K/uL   Basophils Relative 1 %   Basophils Absolute 0.1 0 - 0.1 K/uL  Comprehensive metabolic panel     Status: Abnormal   Collection Time: 12/08/16  8:22 PM  Result Value Ref Range   Sodium 142 135 - 145 mmol/L   Potassium 3.8 3.5 - 5.1 mmol/L   Chloride 109 101 - 111 mmol/L   CO2 24 22 - 32 mmol/L   Glucose, Bld 124 (H) 65 - 99 mg/dL   BUN 29 (H) 6 - 20 mg/dL   Creatinine, Ser 1.50 (H) 0.61 - 1.24 mg/dL   Calcium 8.5 (L) 8.9 - 10.3 mg/dL   Total Protein 7.1 6.5 - 8.1 g/dL   Albumin 3.7 3.5 - 5.0 g/dL   AST 34 15 - 41 U/L   ALT 26 17 - 63 U/L   Alkaline Phosphatase 53 38 - 126 U/L   Total Bilirubin 0.9 0.3 - 1.2 mg/dL   GFR calc non Af Amer 40 (L) >60 mL/min   GFR calc Af Amer 47 (L) >60 mL/min    Comment: (NOTE) The eGFR has been calculated using the CKD EPI equation. This calculation has not been validated in all clinical situations. eGFR's persistently <60 mL/min signify possible Chronic Kidney Disease.    Anion gap 9 5 - 15  Lipase, blood     Status: None   Collection Time: 12/08/16  8:22 PM  Result Value Ref Range   Lipase 17 11 - 51 U/L  Procalcitonin     Status: None   Collection Time: 12/08/16  8:22 PM  Result Value Ref Range   Procalcitonin <0.10 ng/mL    Comment:        Interpretation: PCT (Procalcitonin) <= 0.5 ng/mL: Systemic infection (sepsis) is not likely. Local bacterial infection is possible. (NOTE)         ICU PCT Algorithm               Non ICU PCT Algorithm    ----------------------------     ------------------------------         PCT < 0.25 ng/mL                 PCT < 0.1 ng/mL     Stopping of antibiotics            Stopping of antibiotics       strongly encouraged.  strongly encouraged.    ----------------------------     ------------------------------       PCT level decrease by               PCT < 0.25 ng/mL       >= 80% from peak PCT       OR PCT 0.25 - 0.5 ng/mL          Stopping of antibiotics                                              encouraged.     Stopping of antibiotics           encouraged.    ----------------------------     ------------------------------       PCT level decrease by              PCT >= 0.25 ng/mL       < 80% from peak PCT        AND PCT >= 0.5 ng/mL            Continuin g antibiotics                                              encouraged.       Continuing antibiotics            encouraged.    ----------------------------     ------------------------------     PCT level increase compared          PCT > 0.5 ng/mL         with peak PCT AND          PCT >= 0.5 ng/mL             Escalation of antibiotics                                          strongly encouraged.      Escalation of antibiotics        strongly encouraged.   Protime-INR     Status: None   Collection Time: 12/08/16  8:22 PM  Result Value Ref Range   Prothrombin Time 14.6 11.4 - 15.2 seconds   INR 1.13   Troponin I     Status: None   Collection Time: 12/08/16  8:22 PM  Result Value Ref Range   Troponin I <0.03 <0.03 ng/mL  TSH     Status: None   Collection Time: 12/08/16  8:22 PM  Result Value Ref Range   TSH 3.228 0.350 - 4.500 uIU/mL    Comment: Performed by a 3rd Generation assay with a functional sensitivity of <=0.01 uIU/mL.  MRSA PCR Screening     Status: None   Collection Time: 12/09/16  1:45 AM  Result Value Ref Range   MRSA by PCR NEGATIVE NEGATIVE    Comment:        The GeneXpert MRSA Assay (FDA approved for NASAL specimens only), is one component of a comprehensive MRSA colonization surveillance program. It is not intended to diagnose MRSA infection nor to guide or monitor treatment for MRSA infections.    Dg  Chest Port 1 View  Result Date: 12/08/2016 CLINICAL DATA:  Increase weakness and decreased oral intake for 3 days. Fever. Sepsis. EXAM: PORTABLE CHEST 1 VIEW COMPARISON:  09/10/2016 and 06/17/2014. FINDINGS: 2021 hours. Positioning is limited by the patient's  thoracic kyphosis and dementia. The mandible overlies the lung apices. Left subclavian pacemaker leads appear unchanged within the right atrium and right ventricle. The heart size and mediastinal contours are stable. Mildly increased density has developed at the left lung base. Right basilar atelectasis or scarring appears unchanged. There is no pleural effusion or pneumothorax. No acute osseous findings are seen. IMPRESSION: Interval mildly increased density at the left lung base, potentially sequela of aspiration or early pneumonia. No other significant changes. Electronically Signed   By: Richardean Sale M.D.   On: 12/08/2016 21:01    Review of Systems  Unable to perform ROS: Dementia  Constitutional: Positive for malaise/fatigue.    Blood pressure (!) 137/52, pulse 74, temperature 97.5 F (36.4 C), temperature source Axillary, resp. rate 16, height 6' (1.829 m), weight 77.7 kg (171 lb 4.8 oz), SpO2 96 %. Physical Exam  Constitutional: He is oriented to person, place, and time. He appears well-developed and well-nourished. No distress.  HENT:  Head: Normocephalic and atraumatic.  Mouth/Throat: Oropharynx is clear and moist. No oropharyngeal exudate.  Eyes: Conjunctivae and EOM are normal. Pupils are equal, round, and reactive to light. No scleral icterus.  Neck: Normal range of motion. Neck supple. No JVD present. No tracheal deviation present. No thyromegaly present.  Cardiovascular: Normal rate, regular rhythm and normal heart sounds.  Exam reveals no gallop and no friction rub.   No murmur heard. Respiratory: Effort normal. No respiratory distress. He has decreased breath sounds in the left lower field.  GI: Bowel sounds are normal. He exhibits no distension. There is no tenderness.  Genitourinary:  Genitourinary Comments: Deferred  Musculoskeletal: Normal range of motion. He exhibits no edema.  Lymphadenopathy:    He has no cervical adenopathy.  Neurological: He is alert and oriented  to person, place, and time. No cranial nerve deficit.  Skin: Skin is warm and dry. No rash noted. No erythema.  Psychiatric: He has a normal mood and affect. His behavior is normal. Judgment and thought content normal.     Assessment/Plan This is an 83 year old male admitted for sepsis. 1. Sepsis: The patient meets criteria via fever and tachypnea. Renal function is at baseline and the patient is on minimal supplemental oxygen via nasal cannula. Source is pneumonia. Follow blood cultures were growth and sensitivities. 2. Influenza: type B; start Tamiflu. 3. Dementia: Continue Depakote and Seroquel 4. BPH: Continue finasteride 5. DVT prophylaxis: Heparin 6. GI prophylaxis: None The patient is a full code. Time spent on admission orders and patient care proximally 45 minutes  Harrie Foreman, MD 12/09/2016, 4:11 AM

## 2016-12-10 LAB — URINE CULTURE: CULTURE: NO GROWTH

## 2016-12-10 LAB — BASIC METABOLIC PANEL
ANION GAP: 8 (ref 5–15)
BUN: 20 mg/dL (ref 6–20)
CO2: 26 mmol/L (ref 22–32)
Calcium: 8.1 mg/dL — ABNORMAL LOW (ref 8.9–10.3)
Chloride: 109 mmol/L (ref 101–111)
Creatinine, Ser: 1.31 mg/dL — ABNORMAL HIGH (ref 0.61–1.24)
GFR, EST AFRICAN AMERICAN: 55 mL/min — AB (ref >60)
GFR, EST NON AFRICAN AMERICAN: 48 mL/min — AB (ref >60)
Glucose, Bld: 93 mg/dL (ref 65–99)
POTASSIUM: 3.7 mmol/L (ref 3.5–5.1)
SODIUM: 143 mmol/L (ref 135–145)

## 2016-12-10 LAB — HEMOGLOBIN A1C
Hgb A1c MFr Bld: 4.8 % (ref 4.8–5.6)
Mean Plasma Glucose: 91 mg/dL

## 2016-12-10 MED ORDER — ENSURE ENLIVE PO LIQD
237.0000 mL | Freq: Three times a day (TID) | ORAL | Status: DC
  Administered 2016-12-10 – 2016-12-11 (×3): 237 mL via ORAL

## 2016-12-10 NOTE — NC FL2 (Signed)
[place] MEDICAID FL2 LEVEL OF CARE SCREENING TOOL     IDENTIFICATION  Patient Name: Kyle DitchDonald Bryan Castrejon Birthdate: [birth year] Sex: male Admission Date (Current Location): 12/08/2016  [place]   Facility and Address:  [facility], [address], [place], [place] [address]      Provider Number: [id number]  Attending Physician Name and Address:  Houston SirenVivek J Sainani, MD  Relative Name and Phone Number:       Current Level of Care: Hospital Recommended Level of Care: Assisted Living Facility Prior Approval Number:    Date Approved/Denied:   PASRR Number:    Discharge Plan: Domiciliary (Rest home)    Current Diagnoses: Patient Active Problem List   Diagnosis Date Noted  . HCAP (healthcare-associated pneumonia) 12/09/2016    Orientation RESPIRATION BLADDER Height & Weight     Self  Normal Incontinent Weight: 150 lb 8 oz (68.3 kg) Height:  6' (182.9 cm)  BEHAVIORAL SYMPTOMS/MOOD NEUROLOGICAL BOWEL NUTRITION STATUS      Incontinent    AMBULATORY STATUS COMMUNICATION OF NEEDS Skin   Supervision Verbally Normal                       Personal Care Assistance Level of Assistance  Bathing, Feeding, Dressing Bathing Assistance: Limited assistance Feeding assistance: Independent Dressing Assistance: Limited assistance     Functional Limitations Info  Hearing, Sight Sight Info: Impaired Hearing Info: Adequate      SPECIAL CARE FACTORS FREQUENCY                       Contractures Contractures Info: Present    Additional Factors Info                  Current Medications (12/10/2016):  This is the current hospital active medication list Current Facility-Administered Medications  Medication Dose Route Frequency Provider Last Rate Last Dose  . 0.9 %  sodium chloride infusion   Intravenous Continuous Houston SirenVivek J Sainani, MD 75 mL/hr at 12/09/16 2337 75 mL/hr at 12/09/16 2337  . acetaminophen  (TYLENOL) tablet 650 mg  650 mg Oral Q6H PRN Arnaldo NatalMichael S Diamond, MD       Or  . acetaminophen (TYLENOL) suppository 650 mg  650 mg Rectal Q6H PRN Arnaldo NatalMichael S Diamond, MD      . ALPRAZolam Prudy Feeler(XANAX) tablet 0.5 mg  0.5 mg Oral QHS Arnaldo NatalMichael S Diamond, MD   0.5 mg at 12/09/16 2116  . divalproex (DEPAKOTE SPRINKLE) capsule 250 mg  250 mg Oral TID Arnaldo NatalMichael S Diamond, MD   250 mg at 12/10/16 1047  . docusate sodium (COLACE) capsule 100 mg  100 mg Oral BID Arnaldo NatalMichael S Diamond, MD   100 mg at 12/09/16 2116  . feeding supplement (ENSURE ENLIVE) (ENSURE ENLIVE) liquid 237 mL  237 mL Oral TID BM Houston SirenVivek J Sainani, MD   237 mL at 12/10/16 1105  . finasteride (PROSCAR) tablet 5 mg  5 mg Oral Daily Arnaldo NatalMichael S Diamond, MD   5 mg at 12/10/16 1047  . furosemide (LASIX) tablet 40 mg  40 mg Oral Daily Arnaldo NatalMichael S Diamond, MD   40 mg at 12/10/16 1047  . heparin injection 5,000 Units  5,000 Units Subcutaneous Q8H Arnaldo NatalMichael S Diamond, MD   5,000 Units at 12/10/16 [phone]  . Melatonin TABS 5 mg  1 tablet Oral QHS Arnaldo NatalMichael S Diamond, MD   5 mg at 12/09/16 2116  . ondansetron (ZOFRAN) tablet  4 mg  4 mg Oral Q6H PRN Arnaldo Natal, MD       Or  . ondansetron [facility]) injection 4 mg  4 mg Intravenous Q6H PRN Arnaldo Natal, MD      . oseltamivir (TAMIFLU) capsule 30 mg  30 mg Oral BID Arnaldo Natal, MD   30 mg at 12/10/16 1047  . piperacillin-tazobactam (ZOSYN) IVPB 3.375 g  3.375 g Intravenous Q8H Arnaldo Natal, MD   3.375 g at 12/10/16 1047  . potassium chloride SA (K-DUR,KLOR-CON) CR tablet 20 mEq  20 mEq Oral Daily Arnaldo Natal, MD   20 mEq at 12/10/16 1047  . QUEtiapine (SEROQUEL) tablet 50 mg  50 mg Oral QHS Arnaldo Natal, MD   50 mg at 12/09/16 2115  . sodium chloride flush (NS) 0.9 % injection 3 mL  3 mL Intravenous Q12H Arnaldo Natal, MD   3 mL at 12/09/16 2116  . vitamin B-12 (CYANOCOBALAMIN) tablet 1,000 mcg  1,000 mcg Oral Daily Arnaldo Natal, MD   1,000 mcg at 12/10/16 1047     Discharge  Medications: Please see discharge summary for a list of discharge medications.  Relevant Imaging Results:  Relevant Lab Results:   Additional Information SS# [id number]  Judi Cong, LCSW

## 2016-12-10 NOTE — Progress Notes (Addendum)
[facility] [place] [facility]   PATIENT NAME: Kyle Horne    MR#:  [id number]  DATE OF BIRTH:  [birth year]  SUBJECTIVE:   Pt. Here due to fever and noted to have the Flu.  PO intake remains Poor but mental status improved.  No fever, no acute events overnight.   REVIEW OF SYSTEMS:    Review of Systems  Unable to perform ROS: Dementia    Nutrition: Regular Tolerating Diet: Yes but poor Tolerating PT: await Eval.    DRUG ALLERGIES:  No Known Allergies  VITALS:  Blood pressure 128/81, pulse (!) 56, temperature 98.7 F (37.1 C), temperature source Oral, resp. rate 18, height 6' (1.829 m), weight 68.3 kg (150 lb 8 oz), SpO2 95 %.  PHYSICAL EXAMINATION:   Physical Exam  GENERAL:  83 y.o.-year-old patient lying in the bed in NAD.  EYES: Pupils equal, round, reactive to light and accommodation. No scleral icterus. Extraocular muscles intact.  HEENT: Head atraumatic, normocephalic. Oropharynx and nasopharynx clear.  NECK:  Supple, no jugular venous distention. No thyroid enlargement, no tenderness.  LUNGS: Normal breath sounds bilaterally, no wheezing, rales, rhonchi. No use of accessory muscles of respiration.  CARDIOVASCULAR: S1, S2 normal. No murmurs, rubs, or gallops.  ABDOMEN: Soft, nontender, nondistended. Bowel sounds present. No organomegaly or mass.  EXTREMITIES: No cyanosis, clubbing or edema b/l.    NEUROLOGIC: Cranial nerves II through XII are intact. No focal Motor or sensory deficits b/l. Globally weak.    PSYCHIATRIC: The patient is alert and oriented x 1.   SKIN: No obvious rash, lesion, or ulcer.    LABORATORY PANEL:   CBC  Recent Labs Lab 12/08/16 2022  WBC 9.1  HGB 12.2*  HCT 34.7*  PLT 158   ------------------------------------------------------------------------------------------------------------------  Chemistries   Recent Labs Lab 12/08/16 2022 12/10/16 0805  NA 142 143  K 3.8 3.7  CL 109 109  CO2 24 26   GLUCOSE 124* 93  BUN 29* 20  CREATININE 1.50* 1.31*  CALCIUM 8.5* 8.1*  AST 34  --   ALT 26  --   ALKPHOS 53  --   BILITOT 0.9  --    ------------------------------------------------------------------------------------------------------------------  Cardiac Enzymes  Recent Labs Lab 12/08/16 2022  TROPONINI <0.03   ------------------------------------------------------------------------------------------------------------------  RADIOLOGY:  Dg Chest Port 1 View  Result Date: 12/08/2016 CLINICAL DATA:  Increase weakness and decreased oral intake for 3 days. Fever. Sepsis. EXAM: PORTABLE CHEST 1 VIEW COMPARISON:  09/10/2016 and 06/17/2014. FINDINGS: 2021 hours. Positioning is limited by the patient's thoracic kyphosis and dementia. The mandible overlies the lung apices. Left subclavian pacemaker leads appear unchanged within the right atrium and right ventricle. The heart size and mediastinal contours are stable. Mildly increased density has developed at the left lung base. Right basilar atelectasis or scarring appears unchanged. There is no pleural effusion or pneumothorax. No acute osseous findings are seen. IMPRESSION: Interval mildly increased density at the left lung base, potentially sequela of aspiration or early pneumonia. No other significant changes. Electronically Signed   By: Carey BullocksWilliam  Veazey M.D.   On: 12/08/2016 21:01     ASSESSMENT AND PLAN:   83 year old male with past medical history of dementia, chronic kidney disease, anxiety, presented to the hospital due to fever and noted to be positive for the flu.  1. Flu-patient was positive for influenza B by PCR. -Continue droplet precautions, Tamiflu. Cont. IV fluids.    2. Pneumonia-patient's chest x-ray admission was suggestive of a left-sided pneumonia. Suspect  to be aspiration in nature. -Continue IV Zosyn,await Speech eval.   3. BPH-continue finasteride.  4. Anxiety-continue Xanax.  5. Dementia would be able  disturbance-continue Depakote, Seroquel.  6. AKI - cont. Gentle IV fluids and Cr. improving.  -Renal dose meds avoid nephrotoxins  All the records are reviewed and case discussed with Care Management/Social Worker. Management plans discussed with the patient, family and they are in agreement.  CODE STATUS: Full code  DVT Prophylaxis: hep. SQ  TOTAL TIME TAKING CARE OF THIS PATIENT: 25 minutes.   POSSIBLE D/C IN 1-2 DAYS, DEPENDING ON CLINICAL CONDITION.   Houston Siren M.D on 12/10/2016 at 1:36 PM  Between 7am to 6pm - Pager - [phone]  After 6pm go to [email or web address] - Scientist, research (life sciences) [facility]  Office  [phone]  CC: Primary care physician; No PCP Per Patient

## 2016-12-10 NOTE — Clinical Social Work Note (Signed)
Clinical Social Work Assessment  Patient Details  Name: Kyle Horne MRN: [id number] Date of Birth: July 04, 1928  Date of referral:  12/10/16               Reason for consult:  Facility Placement                Permission sought to share information with:  Facility Industrial/product designerContact Representative Permission granted to share information::  Yes, Verbal Permission Granted  Name::        Agency::     Relationship::     Contact Information:     Housing/Transportation Living arrangements for the past 2 months:  Assisted Living Facility Source of Information:  Spouse Patient Interpreter Needed:  None Criminal Activity/Legal Involvement Pertinent to Current Situation/Hospitalization:  No - Comment as needed Significant Relationships:  Merchandiser, retailCommunity Support, Spouse, Adult Children Lives with:  Facility Resident Do you feel safe going back to the place where you live?  Yes Need for family participation in patient care:  Yes (Comment)  Care giving concerns:  Patient admitted from ALF.   Social Worker assessment / plan:  CSW attempted to meet with patient and his family at bedside; however, no family was available and the patient has significant dementia. CSW contacted the patient's spouse for information.  According to [facility], [facility] via non-emergent EMS due to his weakness and significant dementia. The patient at baseline is incontinent of bladder and bowel, and he typically ambulates without assistance. The patient is not on any special diet; although, his PO has been low in the past week due to s/s of influenza. The patient is legally blind due to macular degeneration. The patient's dementia is such that he no longer recognizes any of his family members.    Employment status:  Retired Database administratornsurance information:  Managed Medicare PT Recommendations:  Not assessed at this time Information / Referral to community resources:     Patient/Family's Response to  care:  The patient's wife thanked the CSW for assistance and commented on her satisfaction with the entire hospitalization process thus far.  Patient/Family's Understanding of and Emotional Response to Diagnosis, Current Treatment, and Prognosis:  The patient has significant dementia, and his family is aware of the limitations of such.  Emotional Assessment Appearance:  Appears stated age Attitude/Demeanor/Rapport:  Lethargic Affect (typically observed):  Withdrawn Orientation:   (The patient has significant dementia with limited orientation to self.) Alcohol / Substance use:  Never Used Psych involvement (Current and /or in the community):  No (Comment)  Discharge Needs  Concerns to be addressed:  Care Coordination Readmission within the last 30 days:  No Current discharge risk:  None Barriers to Discharge:  Continued Medical Work up   [employer] CorporationKaren M Keefe Zawistowski, LCSW 12/10/2016, 12:54 PM

## 2016-12-11 LAB — BASIC METABOLIC PANEL
Anion gap: 7 (ref 5–15)
BUN: 17 mg/dL (ref 6–20)
CALCIUM: 8.4 mg/dL — AB (ref 8.9–10.3)
CHLORIDE: 109 mmol/L (ref 101–111)
CO2: 28 mmol/L (ref 22–32)
CREATININE: 1.28 mg/dL — AB (ref 0.61–1.24)
GFR, EST AFRICAN AMERICAN: 57 mL/min — AB (ref >60)
GFR, EST NON AFRICAN AMERICAN: 49 mL/min — AB (ref >60)
Glucose, Bld: 88 mg/dL (ref 65–99)
Potassium: 3.8 mmol/L (ref 3.5–5.1)
SODIUM: 144 mmol/L (ref 135–145)

## 2016-12-11 MED ORDER — OSELTAMIVIR PHOSPHATE 30 MG PO CAPS: 30.0000 mg | ORAL_CAPSULE | Freq: Two times a day (BID) | ORAL | 0 refills | Status: AC

## 2016-12-11 MED ORDER — POTASSIUM CHLORIDE 20 MEQ PO PACK
20.0000 meq | PACK | Freq: Every day | ORAL | Status: DC
  Administered 2016-12-11 – 2016-12-12 (×2): 20 meq via ORAL
  Filled 2016-12-11 (×2): qty 1

## 2016-12-11 MED ORDER — AMOXICILLIN-POT CLAVULANATE 875-125 MG PO TABS
1.0000 | ORAL_TABLET | Freq: Two times a day (BID) | ORAL | Status: DC
  Administered 2016-12-11 – 2016-12-12 (×2): 1 via ORAL
  Filled 2016-12-11 (×2): qty 1

## 2016-12-11 MED ORDER — AMOXICILLIN-POT CLAVULANATE 875-125 MG PO TABS: 1.0000 | ORAL_TABLET | Freq: Two times a day (BID) | ORAL | 0 refills | Status: AC

## 2016-12-11 MED ORDER — AMOXICILLIN-POT CLAVULANATE 875-125 MG PO TABS: 1.0000 | ORAL_TABLET | Freq: Two times a day (BID) | ORAL | Status: DC

## 2016-12-11 NOTE — Progress Notes (Addendum)
PT is recommending SNF. [facility] RN Tiffany stated that patient will need to go to SNF before [facility]. Clinical Social Worker (CSW) contacted patient's wife Kyle Horne and made her aware of above. Kyle Horne is agreeable to SNF search.   CSW presented bed offers to Kyle Horne and she chose [facility]. [facility] is pending. [facility] coordinator is aware of above. CSW will continue to follow and assist as needed.   [employer] Hughes IncorporatedBailey Justus Duerr, LCSW [phone]

## 2016-12-11 NOTE — [place] FL2 (Signed)
[place] MEDICAID FL2 LEVEL OF CARE SCREENING TOOL     IDENTIFICATION  Patient Name: Kyle Horne Birthdate: 1928-01-20 Sex: male Admission Date (Current Location): 12/08/2016  [place] and [place] Number:  Chiropodist and Address:  [facility], [address] [place], [place] [address]      Provider Number: [id number]  Attending Physician Name and Address:  Houston Siren, MD  Relative Name and Phone Number:       Current Level of Care: Hospital Recommended Level of Care: Skilled Nursing Facility  Prior Approval Number:    Date Approved/Denied:   PASRR Number:    Discharge Plan: Skilled Nursing Facility     Current Diagnoses: Patient Active Problem List   Diagnosis Date Noted  . HCAP (healthcare-associated pneumonia) 12/09/2016  Flu  Orientation RESPIRATION BLADDER Height & Weight     Self  Normal Incontinent Weight: 149 lb 9.6 oz (67.9 kg) Height:  6' (182.9 cm)  BEHAVIORAL SYMPTOMS/MOOD NEUROLOGICAL BOWEL NUTRITION STATUS      Incontinent  DYS 3   AMBULATORY STATUS COMMUNICATION OF NEEDS Skin   Extensive Assist  Verbally Normal                       Personal Care Assistance Level of Assistance  Bathing, Feeding, Dressing Bathing Assistance: Limited assistance Feeding assistance: Independent Dressing Assistance: Limited assistance     Functional Limitations Info  Hearing, Sight Sight Info: Impaired Hearing Info: Adequate      SPECIAL CARE FACTORS FREQUENCY   PT and OT       5 days per week               Contractures Contractures Info: Present    Additional Factors Info   Droplet precautions       Patient is positive for flu          Current Medications (12/11/2016):  This is the current hospital active medication list Current Facility-Administered Medications  Medication Dose Route Frequency Provider Last Rate Last Dose  . 0.9 %  sodium chloride infusion   Intravenous Continuous  Houston Siren, MD 75 mL/hr at 12/10/16 2341    . acetaminophen (TYLENOL) tablet 650 mg  650 mg Oral Q6H PRN Arnaldo Natal, MD       Or  . acetaminophen (TYLENOL) suppository 650 mg  650 mg Rectal Q6H PRN Arnaldo Natal, MD      . ALPRAZolam Prudy Feeler) tablet 0.5 mg  0.5 mg Oral QHS Arnaldo Natal, MD   0.5 mg at 12/10/16 2303  . divalproex (DEPAKOTE SPRINKLE) capsule 250 mg  250 mg Oral TID Arnaldo Natal, MD   250 mg at 12/11/16 8119  . docusate sodium (COLACE) capsule 100 mg  100 mg Oral BID Arnaldo Natal, MD   100 mg at 12/11/16 1478  . feeding supplement (ENSURE ENLIVE) (ENSURE ENLIVE) liquid 237 mL  237 mL Oral TID BM Houston Siren, MD   237 mL at 12/10/16 1400  . finasteride (PROSCAR) tablet 5 mg  5 mg Oral Daily Arnaldo Natal, MD   5 mg at 12/11/16 2956  . furosemide (LASIX) tablet 40 mg  40 mg Oral Daily Arnaldo Natal, MD   40 mg at 12/11/16 2130  . heparin injection 5,000 Units  5,000 Units Subcutaneous Q8H Arnaldo Natal, MD   5,000 Units at 12/11/16 0600  . Melatonin TABS 5 mg  1 tablet Oral  QHS Arnaldo NatalMichael S Diamond, MD   5 mg at 12/10/16 2200  . ondansetron (ZOFRAN) tablet 4 mg  4 mg Oral Q6H PRN Arnaldo NatalMichael S Diamond, MD       Or  . ondansetron [facility](ZOFRAN) injection 4 mg  4 mg Intravenous Q6H PRN Arnaldo NatalMichael S Diamond, MD      . oseltamivir (TAMIFLU) capsule 30 mg  30 mg Oral BID Arnaldo NatalMichael S Diamond, MD   30 mg at 12/10/16 2302  . piperacillin-tazobactam (ZOSYN) IVPB 3.375 g  3.375 g Intravenous Q8H Arnaldo NatalMichael S Diamond, MD   3.375 g at 12/11/16 [id number]  . potassium chloride (KLOR-CON) packet 20 mEq  20 mEq Oral Daily Houston SirenVivek J Sainani, MD      . QUEtiapine (SEROQUEL) tablet 50 mg  50 mg Oral QHS Arnaldo NatalMichael S Diamond, MD   50 mg at 12/10/16 2302  . sodium chloride flush (NS) 0.9 % injection 3 mL  3 mL Intravenous Q12H Arnaldo NatalMichael S Diamond, MD   3 mL at 12/11/16 1009  . vitamin B-12 (CYANOCOBALAMIN) tablet 1,000 mcg  1,000 mcg Oral Daily Arnaldo NatalMichael S Diamond, MD   1,000 mcg at 12/11/16  [id number]     Discharge Medications: Please see discharge summary for a list of discharge medications.  Relevant Imaging Results:  Relevant Lab Results:   Additional Information SS# [id number]  Positive for flu  Kyle Horne, Kyle CrockerBailey M, LCSW

## 2016-12-11 NOTE — Progress Notes (Signed)
[employer] - [place] [facility]   PATIENT NAME: Kyle Horne    MR#:  [id number]  DATE OF BIRTH:  [birth year]  SUBJECTIVE:   Pt. Here due to fever and noted to have the Flu.  No fever overnight. NO other acute events overnight. Seen by speech and placed on dysphagia III diet  REVIEW OF SYSTEMS:    Review of Systems  Unable to perform ROS: Dementia    Nutrition: Regular Tolerating Diet: Yes but poor Tolerating PT: Eval noted.     DRUG ALLERGIES:  No Known Allergies  VITALS:  Blood pressure 122/67, pulse 62, temperature 97.9 F (36.6 C), resp. rate 18, height 6' (1.829 m), weight 67.9 kg (149 lb 9.6 oz), SpO2 95 %.  PHYSICAL EXAMINATION:   Physical Exam  GENERAL:  83 y.o.-year-old patient lying in the bed in NAD.  EYES: Pupils equal, round, reactive to light and accommodation. No scleral icterus. Extraocular muscles intact.  HEENT: Head atraumatic, normocephalic. Oropharynx and nasopharynx clear.  NECK:  Supple, no jugular venous distention. No thyroid enlargement, no tenderness.  LUNGS: Normal breath sounds bilaterally, no wheezing, rales, rhonchi. No use of accessory muscles of respiration.  CARDIOVASCULAR: S1, S2 normal. No murmurs, rubs, or gallops.  ABDOMEN: Soft, nontender, nondistended. Bowel sounds present. No organomegaly or mass.  EXTREMITIES: No cyanosis, clubbing or edema b/l.    NEUROLOGIC: Cranial nerves II through XII are intact. No focal Motor or sensory deficits b/l. Globally weak.    PSYCHIATRIC: The patient is alert and oriented x 1.   SKIN: No obvious rash, lesion, or ulcer.    LABORATORY PANEL:   CBC  Recent Labs Lab 12/08/16 2022  WBC 9.1  HGB 12.2*  HCT 34.7*  PLT 158   ------------------------------------------------------------------------------------------------------------------  Chemistries   Recent Labs Lab 12/08/16 2022  12/11/16 0405  NA 142  < > 144  K 3.8  < > 3.8  CL 109  < > 109  CO2 24  < > 28   GLUCOSE 124*  < > 88  BUN 29*  < > 17  CREATININE 1.50*  < > 1.28*  CALCIUM 8.5*  < > 8.4*  AST 34  --   --   ALT 26  --   --   ALKPHOS 53  --   --   BILITOT 0.9  --   --   < > = values in this interval not displayed. ------------------------------------------------------------------------------------------------------------------  Cardiac Enzymes  Recent Labs Lab 12/08/16 2022  TROPONINI <0.03   ------------------------------------------------------------------------------------------------------------------  RADIOLOGY:  No results found.   ASSESSMENT AND PLAN:   83 year old male with past medical history of dementia, chronic kidney disease, anxiety, presented to the hospital due to fever and noted to be positive for the flu.  1. Flu-patient was positive for influenza B by PCR. -Continue droplet precautions, Tamiflu.  Improving.    2. Pneumonia-patient's chest x-ray admission was suggestive of a left-sided pneumonia. Suspect to be aspiration in nature. - seen by speech and placed on dysphagia III diet with thin liquids and asp. Precautions.  - on IV Zosyn and can d/c on Oral Augmentin upon discharge.   3. BPH-continue finasteride.  4. Anxiety-continue Xanax.  5. Dementia without behavioral disturbance-continue Depakote, Seroquel.  6. AKI - improved and resolved w/ IV fluids.    -Renal dose meds avoid nephrotoxins  All the records are reviewed and case discussed with Care Management/Social Worker. Management plans discussed with the patient, family and they are in agreement.  CODE STATUS: Full code  DVT Prophylaxis: hep. SQ  TOTAL TIME TAKING CARE OF THIS PATIENT: 25 minutes.   POSSIBLE D/C IN 1-2 DAYS, DEPENDING ON CLINICAL CONDITION.   Houston Siren M.D on 12/11/2016 at 2:10 PM  Between 7am to 6pm - Pager - [phone]  After 6pm go to [email or web address] - Scientist, research (life sciences) [facility]  Office  [phone]  CC: Primary  care physician; No PCP Per Patient

## 2016-12-11 NOTE — Progress Notes (Signed)
Initial Nutrition Assessment  DOCUMENTATION CODES:   Severe malnutrition in context of chronic illness  INTERVENTION:  1. Continue Ensure Enlive po TID, each supplement provides 350 kcal and 20 grams of protein 2. Continue Magic cup TID with meals, each supplement provides 290 kcal and 9 grams of protein  NUTRITION DIAGNOSIS:   Malnutrition related to chronic illness as evidenced by severe depletion of muscle mass, severe depletion of body fat.  GOAL:   Patient will meet greater than or equal to 90% of their needs  MONITOR:   PO intake, I & O's, Labs, Weight trends, Supplement acceptance  REASON FOR ASSESSMENT:   Malnutrition Screening Tool    ASSESSMENT:    The patient with past medical history of dementia as well as chronic kidney disease presents to emergency department from his nursing home with fever. Maximum temperature 101.35F upon arrival. The patient states that he has felt very fatigued and worn out.   Patient with dementia, unable to provide history. Per chart, exhibits a 15#/9.1% severe wt loss over 3 months. Nutrition-Focused physical exam completed. Findings are severe fat depletion, severe muscle depletion, and no edema.  Ate ~50% of his meal this morning. Bagel, Eggs, [employer] Cup, Grits, [employer] Juice, and [employer]read Pudding.  Labs and medications reviewed: B12, Colace, KCL NS @ 5175mL/hr  Diet Order:  DIET DYS 3 Room service appropriate? Yes with Assist; Fluid consistency: Thin  Skin:  Reviewed, no issues  Last BM:  12/09/2016  Height:   Ht Readings from Last 1 Encounters:  12/08/16 6' (1.829 m)    Weight:   Wt Readings from Last 1 Encounters:  12/11/16 149 lb 9.6 oz (67.9 kg)    Ideal Body Weight:  80.9 kg  BMI:  Body mass index is 20.29 kg/m.  Estimated Nutritional Needs:   Kcal:  1500-1800 calories  Protein:  67-81 gm  Fluid:  >/= 1.5L  EDUCATION NEEDS:   No education needs identified at this time  Dionne AnoWilliam M. Demaya Hardge, MS, RD  LDN Inpatient Clinical Dietitian Pager [phone]

## 2016-12-11 NOTE — Evaluation (Signed)
Clinical/Bedside Swallow Evaluation Patient Details  Name: Kyle Horne MRN: [id number] Date of Birth: 10-31-1927  Today's Date: 12/11/2016 Time: SLP Start Time (ACUTE ONLY): 0900 SLP Stop Time (ACUTE ONLY): 1000 SLP Time Calculation (min) (ACUTE ONLY): 60 min  Past Medical History:  Past Medical History:  Diagnosis Date  . Alzheimer disease   . Anemia   . Anxiety disorder   . CKD (chronic kidney disease)   . Vitamin D deficiency    Past Surgical History: History reviewed. No pertinent surgical history. HPI:  The patient with past medical history of dementia as well as chronic kidney disease presents to emergency department from his nursing home with fever. Maximum temperature 101.59F upon arrival. The patient states that he has felt very fatigued and worn out. He cannot put her finger on what is bothering him. Be on this the patient is not able to provide significant medical history. Chest x-ray in the emergency department revealed a possible left lower lobe pneumonia. Laboratory evaluation was also positive for influenza infection. Oxygen saturations were 89% on room air. She says emergency department staff called the hospitalist service for admission.   Assessment / Plan / Recommendation Clinical Impression  Pt currently appears to present at mild aspiration risk following general aspiration precautions with PO trials of thin liquids, puree and soft solids. No oral phase deficits noted with trials c/b adequate A-P transfer, no anterior spillage.  D/t pt's mild-moderate cogitive decline, pt appeared very tangential and easily distracted. Pt educated on gernal aspiration precautions, sign posted in room. D/t pt's cognitive decline, recommend dysphagia level 3 diet (mech soft) with well chopped meats, thin liquids (no straw) following strict aspiration precautions. Meds crushed in applesauce. Pt does require set up and verbal/tactile cues (encourgment) . Skilled ST services to f/u for  diet toleration. NSG updated.  SLP Visit Diagnosis: Dysphagia, oropharyngeal phase (R13.12)    Aspiration Risk  Mild aspiration risk    Diet Recommendation   Dysphagia level 3 (mech soft) with well chopped meats, thin liquids via cup sips (no straws) following strict aspiration precautions. Meds crushed in applesauce. Pt requires verbal/tactile cues and encouragement, Tray set up.   Medication Administration: Crushed with puree    Other  Recommendations Recommended Consults:  (dietician ) Oral Care Recommendations: Oral care BID   Follow up Recommendations  (TBD)      Frequency and Duration min 2x/week  1 week       Prognosis Prognosis for Safe Diet Advancement: Good Barriers to Reach Goals: Cognitive deficits      Swallow Study   General Date of Onset: 12/08/16 HPI: The patient with past medical history of dementia as well as chronic kidney disease presents to emergency department from his nursing home with fever. Maximum temperature 101.59F upon arrival. The patient states that he has felt very fatigued and worn out. He cannot put her finger on what is bothering him. Be on this the patient is not able to provide significant medical history. Chest x-ray in the emergency department revealed a possible left lower lobe pneumonia. Laboratory evaluation was also positive for influenza infection. Oxygen saturations were 89% on room air. She says emergency department staff called the hospitalist service for admission. Type of Study: Bedside Swallow Evaluation Previous Swallow Assessment: none noted Diet Prior to this Study: Regular;Thin liquids Temperature Spikes Noted: No Respiratory Status: Room air History of Recent Intubation: No Behavior/Cognition: Alert;Impulsive;Distractible;Requires cueing Oral Cavity Assessment: Within Functional Limits Oral Care Completed by SLP: Recent completion by staff  Oral Cavity - Dentition: Adequate natural dentition Vision: Functional for  self-feeding Self-Feeding Abilities: Able to feed self;Needs assist;Needs set up Patient Positioning: Upright in bed Baseline Vocal Quality: Normal;Low vocal intensity Volitional Cough:  (dnt) Volitional Swallow:  (dnt)    Oral/Motor/Sensory Function Overall Oral Motor/Sensory Function: Within functional limits   Ice Chips Ice chips: Not tested   Thin Liquid Thin Liquid: Within functional limits Presentation: Cup;Self Fed (5 trials)    Nectar Thick Nectar Thick Liquid: Not tested   Honey Thick Honey Thick Liquid: Not tested   Puree Puree: Within functional limits Presentation: Self Fed;Spoon   Solid   GO   Solid: Within functional limits Presentation: Self Fed;Spoon        Ardelia Mems, B.S Graduate Clinician  12/11/2016,10:57 AM    This information has been reviewed and agreed upon by this supervising clinician.  Jerilynn Som, MS, CCC-SLP

## 2016-12-11 NOTE — Evaluation (Signed)
Physical Therapy Evaluation Patient Details Name: Kyle FisherDonald Bryan Horne MRN: [id number] DOB: 25-May-1928 Today's Date: 12/11/2016   History of Present Illness  presented to ER from memory care secondary to fever, general malaise; admitted with sepsis related to PNA, influenza +.  Clinical Impression  Upon evaluation, patient alert and oriented to self only; generally restless, constantly fidgeting with covers, washcloth, napkins in bed.  Very limited ability to actively follow commands this date, requiring hand-over hand for initiation of all purposeful functional activities.  Slightly agitated at times, but redirectable with cuing, repetition from therapist.  Requiring mod/max assist +2 for all sit/stand, bed/chair transfers with RW.  Very unsteady with heavy posterior lean during all standing/mobility efforts.  Patient intermittently resisting mobility efforts, but agreeable with continued efforts.  Poor standing balance; unsafe to attempt without heavy +2 assist at this time. Would benefit from skilled PT to address above deficits and promote optimal return to PLOF; recommend transition to STR upon discharge from acute hospitalization with attention to ability to fully participate/progress with skilled interventions.    Follow Up Recommendations SNF    Equipment Recommendations       Recommendations for Other Services       Precautions / Restrictions Precautions Precautions: Fall Precaution Comments: Droplet iso Restrictions Weight Bearing Restrictions: No      Mobility  Bed Mobility Overal bed mobility: Needs Assistance Bed Mobility: Supine to Sit     Supine to sit: Mod assist;+2 for physical assistance     General bed mobility comments: hand-over-hand for UE placement, truncal elevation  Transfers Overall transfer level: Needs assistance Equipment used: Rolling walker (2 wheeled) Transfers: Sit to/from Stand Sit to Stand: Mod assist;Max assist;+2 physical assistance          General transfer comment: resisting initial attempt with heavy retropulsion; improved effort and willingness to participate with second attempt  Ambulation/Gait Ambulation/Gait assistance: Mod assist;Max assist;+2 physical assistance Ambulation Distance (Feet): 4 Feet Assistive device: Rolling walker (2 wheeled)       General Gait Details: forward flexed, posterior weight shift. Very short, shuffling steps; poor attention to task, limited ability to follow targeted instructions mid-transfer.  Very high fall risk; unsafe to attempt without +2.  Stairs            Wheelchair Mobility    Modified Rankin (Stroke Patients Only)       Balance Overall balance assessment: Needs assistance Sitting-balance support: No upper extremity supported;Feet supported Sitting balance-Leahy Scale: Fair   Postural control: Posterior lean Standing balance support: Bilateral upper extremity supported Standing balance-Leahy Scale: Poor                               Pertinent Vitals/Pain Pain Assessment: Faces Faces Pain Scale: No hurt    Home Living Family/patient expects to be discharged to:: Skilled nursing facility [facility]                      Prior Function Level of Independence: Needs assistance         Comments: Per social worker, per facility, patient ambulatory without assist device at baseline; has been requiring RW and +2 assist for all mobility due to recent acute illness.     Hand Dominance        Extremity/Trunk Assessment   Upper Extremity Assessment Upper Extremity Assessment: Generalized weakness    Lower Extremity Assessment Lower Extremity Assessment: Generalized weakness (grossly at  least 3/5; unable to participate with formal MMT due to inabiltiy to follow commands. Supports body weight in standing without buckling.)       Communication   Communication: HOH  Cognition Arousal/Alertness:  Awake/alert Behavior During Therapy: Impulsive Overall Cognitive Status: History of cognitive impairments - at baseline                                 General Comments: oriented to self only; inconsistently follows simple commands, requiring hand-over-hand for initiation of all functional activities.  Physically resisting at times, but redirectable with mod enouragement, repetition of cuing.      General Comments      Exercises     Assessment/Plan    PT Assessment Patient needs continued PT services  PT Problem List Decreased strength;Decreased range of motion;Decreased activity tolerance;Decreased balance;Decreased mobility;Decreased coordination;Decreased cognition;Decreased knowledge of use of DME;Decreased safety awareness;Decreased knowledge of precautions;Cardiopulmonary status limiting activity       PT Treatment Interventions DME instruction;Gait training;Functional mobility training;Therapeutic activities;Therapeutic exercise;Balance training;Neuromuscular re-education;Patient/family education    PT Goals (Current goals can be found in the Care Plan section)  Acute Rehab PT Goals PT Goal Formulation: Patient unable to participate in goal setting Time For Goal Achievement: 12/25/16 Potential to Achieve Goals: Fair    Frequency Min 2X/week   Barriers to discharge Decreased caregiver support      Co-evaluation               End of Session Equipment Utilized During Treatment: Gait belt Activity Tolerance:  (limited by cognitive status) Patient left: in chair;with call bell/phone within reach;with chair alarm set Nurse Communication: Mobility status (need for +2 upon return to bed) PT Visit Diagnosis: Muscle weakness (generalized) (M62.81);Unsteadiness on feet (R26.81)    Time: 1610-9604 PT Time Calculation (min) (ACUTE ONLY): 22 min   Charges:   PT Evaluation $PT Eval Moderate Complexity: 1 Procedure     PT G Codes:       Alejandra Barna H.  Manson Passey, PT, DPT, NCS 12/11/16, 4:08 PM [phone]

## 2016-12-11 NOTE — Discharge Summary (Addendum)
[facility] - [place] at [facility]   PATIENT NAME: Kyle Horne    MR#:  [id number]  DATE OF BIRTH:  [birth year]  DATE OF ADMISSION:  12/08/2016 ADMITTING PHYSICIAN: Arnaldo Natal, MD  DATE OF DISCHARGE: 12/11/2016  PRIMARY CARE PHYSICIAN: No PCP Per Patient    ADMISSION DIAGNOSIS:  Influenza [J11.1] Fever, unspecified fever cause [R50.9] Alzheimer's dementia with behavioral disturbance, unspecified timing of dementia onset [G30.8, F02.81] Community acquired pneumonia of left lower lobe of lung (HCC) [J18.1]  DISCHARGE DIAGNOSIS:  Active Problems:   HCAP (healthcare-associated pneumonia)   SECONDARY DIAGNOSIS:   Past Medical History:  Diagnosis Date  . Alzheimer disease   . Anemia   . Anxiety disorder   . CKD (chronic kidney disease)   . Vitamin D deficiency     HOSPITAL COURSE:   83 year old male with past medical history of dementia, chronic kidney disease, anxiety, presented to the hospital due to fever and noted to be positive for the flu.  1. Flu-patient was positive for influenza B by PCR. - he was placed on Tamiflu and now being discharged on it. Much improved and has no hypoxia, fever.   2. Pneumonia-patient's chest x-ray admission was suggestive of a left-sided pneumonia. It was suspected to be aspiration in nature. - seen by speech and placed on dysphagia III diet with thin liquids with asp. Precautions.  - was on IV Zosyn and now being discharged on Oral Augmentin.   3. BPH- he will continue finasteride. - no urinary retention.   4. Anxiety- he will continue Xanax.  5. Dementia without behavioral disturbance- he will continue Depakote, Seroquel.  6. AKI - improved and resolved w/ IV fluids.    - Cr. Back to baseline.   DISCHARGE CONDITIONS:   Stable.   CONSULTS OBTAINED:    DRUG ALLERGIES:  No Known Allergies  DISCHARGE MEDICATIONS:   Allergies as of 12/11/2016   No Known Allergies     Medication List     TAKE these medications   acetaminophen 500 MG tablet Commonly known as:  TYLENOL Take 500 mg by mouth 3 (three) times daily.   ALPRAZolam 0.5 MG tablet Commonly known as:  XANAX Take 0.5 mg by mouth at bedtime.   amoxicillin-clavulanate 875-125 MG tablet Commonly known as:  AUGMENTIN Take 1 tablet by mouth 2 (two) times daily.   divalproex 125 MG capsule Commonly known as:  DEPAKOTE SPRINKLE Take 250 mg by mouth 3 (three) times daily.   finasteride 5 MG tablet Commonly known as:  PROSCAR Take 5 mg by mouth daily.   furosemide 40 MG tablet Commonly known as:  LASIX Take 40 mg by mouth daily.   Melatonin 5 MG Tabs Take 1 tablet by mouth at bedtime.   oseltamivir 30 MG capsule Commonly known as:  TAMIFLU Take 1 capsule (30 mg total) by mouth 2 (two) times daily.   potassium chloride 10 MEQ CR capsule Commonly known as:  MICRO-K Take 20 mEq by mouth daily.   QUEtiapine 50 MG tablet Commonly known as:  SEROQUEL Take 50 mg by mouth at bedtime.   vitamin B-12 1000 MCG tablet Commonly known as:  CYANOCOBALAMIN Take 1,000 mcg by mouth daily.         DISCHARGE INSTRUCTIONS:   DIET:  Dysphagia III w/ thin liquids.  Strict aspiration precautions.   DISCHARGE CONDITION:  Stable  ACTIVITY:  Activity as tolerated  OXYGEN:  Home Oxygen: No.   Oxygen Delivery: room air  DISCHARGE LOCATION:  nursing home   If you experience worsening of your admission symptoms, develop shortness of breath, life threatening emergency, suicidal or homicidal thoughts you must seek medical attention immediately by calling 911 or calling your MD immediately  if symptoms less severe.  You Must read complete instructions/literature along with all the possible adverse reactions/side effects for all the Medicines you take and that have been prescribed to you. Take any new Medicines after you have completely understood and accpet all the possible adverse reactions/side effects.   Please  note  You were cared for by a hospitalist during your hospital stay. If you have any questions about your discharge medications or the care you received while you were in the hospital after you are discharged, you can call the unit and asked to speak with the hospitalist on call if the hospitalist that took care of you is not available. Once you are discharged, your primary care physician will handle any further medical issues. Please note that NO REFILLS for any discharge medications will be authorized once you are discharged, as it is imperative that you return to your primary care physician (or establish a relationship with a primary care physician if you do not have one) for your aftercare needs so that they can reassess your need for medications and monitor your lab values.  Today  Subjective:  No acute events overnight.  No new complaints.  No fever.   GENERAL: Pleasant-appearing in no apparent distress.  HEAD, EYES, EARS, NOSE AND THROAT: Atraumatic, normocephalic. Extraocular muscles are intact. Pupils equal and reactive to light. Sclerae anicteric. No conjunctival injection. No oro-pharyngeal erythema.  NECK: Supple. There is no jugular venous distention. No bruits, no lymphadenopathy, no thyromegaly.  HEART: Regular rate and rhythm, tachycardic. No murmurs, no rubs, no clicks.  LUNGS: Clear to auscultation bilaterally. No rales or rhonchi. No wheezes.  ABDOMEN: Soft, flat, nontender, nondistended. Has good bowel sounds. No hepatosplenomegaly appreciated.  EXTREMITIES: No evidence of any cyanosis, clubbing, or peripheral edema.  +2 pedal and radial pulses bilaterally.  NEUROLOGIC: The patient is alert, awake, and oriented x 1 with no focal motor or sensory deficits appreciated bilaterally.  SKIN: Moist and warm with no rashes appreciated.  LYMPHATIC: No cervical or axillary lymphadenopathy.       Microbiology Results  Results for orders placed or performed during the hospital  encounter of 12/08/16  Blood Culture (routine x 2)     Status: None (Preliminary result)   Collection Time: 12/08/16  8:22 PM  Result Value Ref Range Status   Specimen Description BLOOD LEFT ANTECUBITAL  Final   Special Requests   Final    BOTTLES DRAWN AEROBIC AND ANAEROBIC Blood Culture adequate volume   Culture NO GROWTH 3 DAYS  Final   Report Status PENDING  Incomplete  Blood Culture (routine x 2)     Status: None (Preliminary result)   Collection Time: 12/08/16  8:22 PM  Result Value Ref Range Status   Specimen Description BLOOD RIGHT ANTECUBITAL  Final   Special Requests   Final    BOTTLES DRAWN AEROBIC AND ANAEROBIC Blood Culture adequate volume   Culture NO GROWTH 3 DAYS  Final   Report Status PENDING  Incomplete  Urine culture     Status: None   Collection Time: 12/08/16  8:22 PM  Result Value Ref Range Status   Specimen Description URINE, RANDOM  Final   Special Requests NONE  Final   Culture   Final    NO GROWTH  Performed at [facility], [address] [place], [place] [address]    Report Status 12/10/2016 FINAL  Final  MRSA PCR Screening     Status: None   Collection Time: 12/09/16  1:45 AM  Result Value Ref Range Status   MRSA by PCR NEGATIVE NEGATIVE Final    Comment:        The GeneXpert MRSA Assay (FDA approved for NASAL specimens only), is one component of a comprehensive MRSA colonization surveillance program. It is not intended to diagnose MRSA infection nor to guide or monitor treatment for MRSA infections.     RADIOLOGY:  No results found.    Management plans discussed with the patient, family and they are in agreement.  CODE STATUS:     Code Status Orders        Start     Ordered   12/09/16 0128  Full code  Continuous     12/09/16 0127    Code Status History    Date Active Date Inactive Code Status Order ID Comments User Context   This patient has a current code status but no historical code status.    Advance Directive  Documentation     Most Recent Value  Type of Advance Directive  Out of facility DNR (pink MOST or yellow form)  Pre-existing out of facility DNR order (yellow form or pink MOST form)  Yellow form placed in chart (order not valid for inpatient use)  "MOST" Form in Place?  -      TOTAL TIME TAKING CARE OF THIS PATIENT: 40 minutes.    Houston SirenSAINANI,Oriah Leinweber J M.D on 12/11/2016 at 3:09 PM  Between 7am to 6pm - Pager - [phone]  After 6pm go to [email or web address] - Scientist, research (life sciences)password [email or web address] [email or web address]  [facility] [place] [facility]  Office  [phone]  CC: Primary care physician; No PCP Per Patient

## 2016-12-12 NOTE — Progress Notes (Signed)
Patient is medically stable for D/C to [facility] today. SNF level PASARR has been received. [facility] today to room 35-B. RN will call report and arrange EMS for transport. Clinical Child psychotherapistocial Worker (CSW) sent [facility] [employer]. CSW contacted patient's wife Gigi Gineggy and made her aware of above. [facility] is aware of above. Please reconsult if future social work needs arise. CSW signing off.   [employer]iley Britteney Ayotte, LCSW [phone]) [phone]

## 2016-12-12 NOTE — Clinical Social Work Placement (Signed)
   CLINICAL SOCIAL WORK PLACEMENT  NOTE  Date:  12/12/2016  Patient Details  Name: Kyle FisherDonald Bryan Lisowski MRN: [id number] Date of Birth: May 28, 1928  Clinical Social Work is seeking post-discharge placement for this patient at the Skilled  Nursing Facility level of care (*CSW will initial, date and re-position this form in  chart as items are completed):  Yes   Patient/family provided with [place] Clinical Social Work Department's list of facilities offering this level of care within the geographic area requested by the patient (or if unable, by the patient's family).  Yes   Patient/family informed of their freedom to choose among providers that offer the needed level of care, that participate in Medicare, Medicaid or managed care program needed by the patient, have an available bed and are willing to accept the patient.  Yes   Patient/family informed of 's ownership interest in [facility] and [facility], as well as of the fact that they are under no obligation to receive care at these facilities.  PASRR submitted to EDS on 12/11/16     PASRR number received on 12/12/16     Existing PASRR number confirmed on       FL2 transmitted to all facilities in geographic area requested by pt/family on 12/11/16     FL2 transmitted to all facilities within larger geographic area on       Patient informed that his/her managed care company has contracts with or will negotiate with certain facilities, including the following:        Yes   Patient/family informed of bed offers received.  Patient chooses bed at  [facility] )     Physician recommends and patient chooses bed at      Patient to be transferred to  [facility] ) on 12/12/16.  Patient to be transferred to facility by  [facility] )     Patient family notified on 12/12/16 of transfer.  Name of family member notified:   (Patient's wife Gigi Gineggy is aware of D/C today. )     PHYSICIAN       Additional Comment:    _______________________________________________ Tykesha Konicki, Darleen CrockerBailey M, LCSW 12/12/2016, 11:51 AM

## 2016-12-12 NOTE — Progress Notes (Signed)
PASARR has been received, [id number]. Patient can D/C to [facility] when medically stable.   [employer]iley Poseidon Pam, LCSW [phone]) [phone]

## 2016-12-12 NOTE — Progress Notes (Signed)
  Speech Language Pathology Treatment: Dysphagia  Patient Details Name: Kyle Horne MRN: [id number] DOB: 1927-11-17 Today's Date: 12/12/2016 Time: [id number] SLP Time Calculation (min) (ACUTE ONLY): 33 min  Assessment / Plan / Recommendation Clinical Impression  Pt seen for ongoing assessment of toleration of diet; trials of recommended diet consistency as ordered post BSE. Pt awake, sitting up in bed w/ assistance - pt required encouragement d/t his moderate-severe confusion. Pt initially declined eating his lunch meal but agreed as long as we "waited awhile". Pt was open to drinking the sweet tea VIA CUP - no straw. Noted min shakiness in UEs but pt was able to hold cup to feed self. No overt coughing noted but a delayed throat clearing was observed after multiple sips. Encouraged pt to slow down, however, w/ declined Cognition at baseline(Dementia), suspect pt is unable to consistently follow through w/ aspiration precautions. Pt helped to hold the cup but required feeding support. When handed the utensil, pt refused taking the bolus and just held it. SLP then fed pt 3 more bites b/f he addamently refused.  Pt remains in increased risk for aspiration secondary to declined Cognitive status; he requires 100% supervision w/ oral intake and encouragement to eat/drink, follow aspiration precautions. Recommend a Dysphagia level 3 diet w/ minced meats/gravy(to lessen mastication effort/time), thin liquids w/ strict aspiration precautions d/t declined Cognitive status/Dementia; assistance w/ feeding. Suggest pt drink more in his diet using drink supplements (per Dietician order). Pills given crushed in Puree for safer swallowing. ST services will f/u w/ toleration of diet next 1-3 days. NSG updated.     HPI HPI: The patient with past medical history of Dementia as well as chronic kidney disease presents to emergency department from his nursing home with fever. Maximum temperature 101.39F upon  arrival. The patient states that he has felt very fatigued and worn out. He cannot put her finger on what is bothering him. Be on this the patient is not able to provide significant medical history. Chest x-ray in the emergency department revealed a possible left lower lobe pneumonia. Laboratory evaluation was also positive for influenza infection. Oxygen saturations were 89% on room air. Currently, pt is confused and often refuses to participate w/ taking po's. Noted tangential jargon; severe confusion.       SLP Plan  Continue with current plan of care       Recommendations  Diet recommendations: Dysphagia 3 (mechanical soft);Dysphagia 2 (fine chop);Thin liquid Liquids provided via: Cup;No straw Medication Administration: Crushed with puree Supervision: Patient able to self feed;Staff to assist with self feeding;Full supervision/cueing for compensatory strategies Compensations: Minimize environmental distractions;Slow rate;Small sips/bites;Lingual sweep for clearance of pocketing;Follow solids with liquid Postural Changes and/or Swallow Maneuvers: Seated upright 90 degrees;Upright 30-60 min after meal                General recommendations:  (Dietician f/u) Oral Care Recommendations: Oral care BID;Staff/trained caregiver to provide oral care Follow up Recommendations: Skilled Nursing facility (TBD) SLP Visit Diagnosis: Dysphagia, oropharyngeal phase (R13.12) Plan: Continue with current plan of care       GO                Jerilynn SomKatherine Watson, MS, CCC-SLP Watson,Katherine 12/12/2016, 2:19 PM

## 2016-12-12 NOTE — Progress Notes (Signed)
Called report to [facility] care given to [facility]Johnathan Caudill. Answered all questions. Packet prepared, belongings given. EMS called for transport.

## 2016-12-13 LAB — CULTURE, BLOOD (ROUTINE X 2)
CULTURE: NO GROWTH
Culture: NO GROWTH
Special Requests: ADEQUATE
Special Requests: ADEQUATE

## 2017-02-16 DEATH — deceased

## 2018-04-26 IMAGING — CR DG CHEST 2V
3 series · 3 of 3 positions shown · non-contrast
Comparison: 06/15/2015

CLINICAL DATA: Cough

EXAM:
CHEST  2 VIEW

[chest lat]
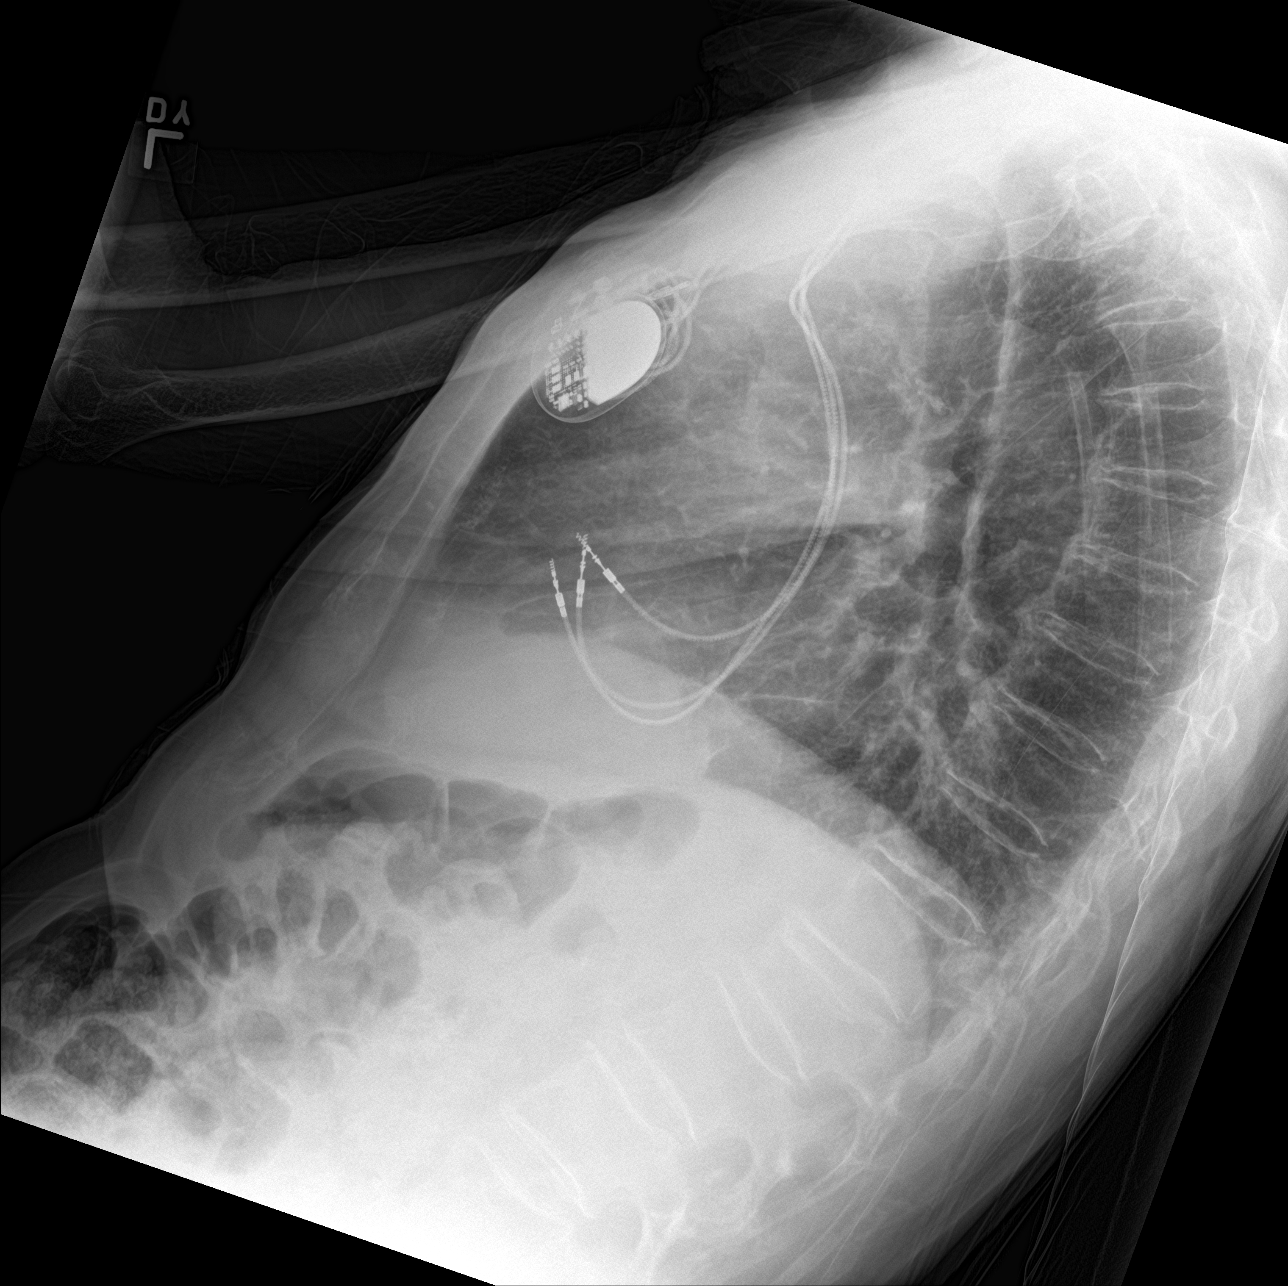

[chest ap (1 of 2)]
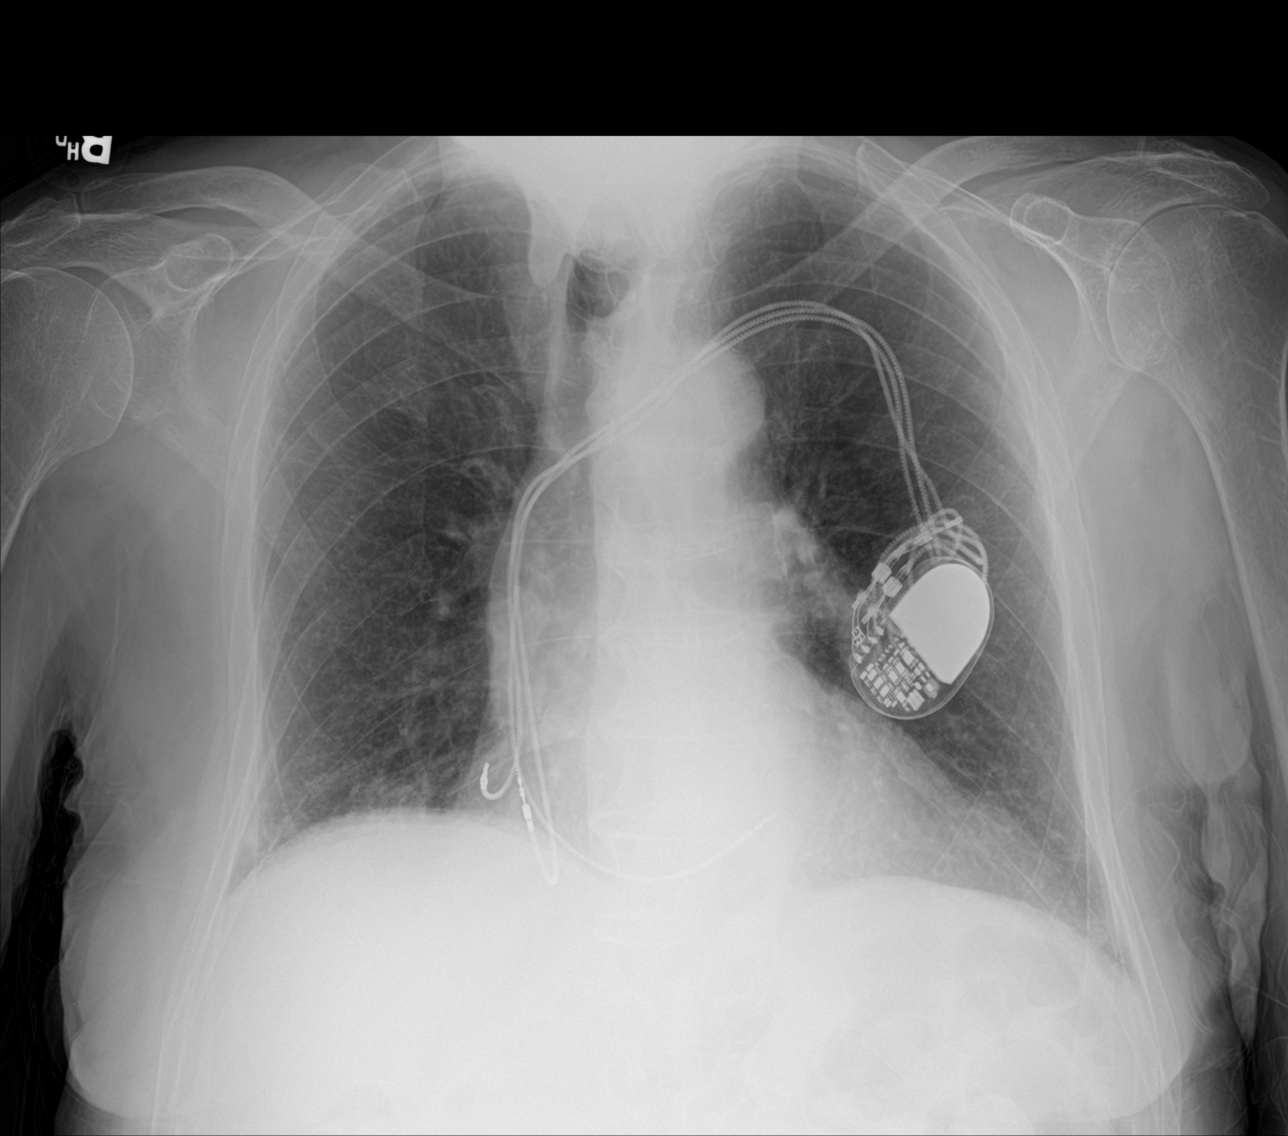

[chest ap (2 of 2)]
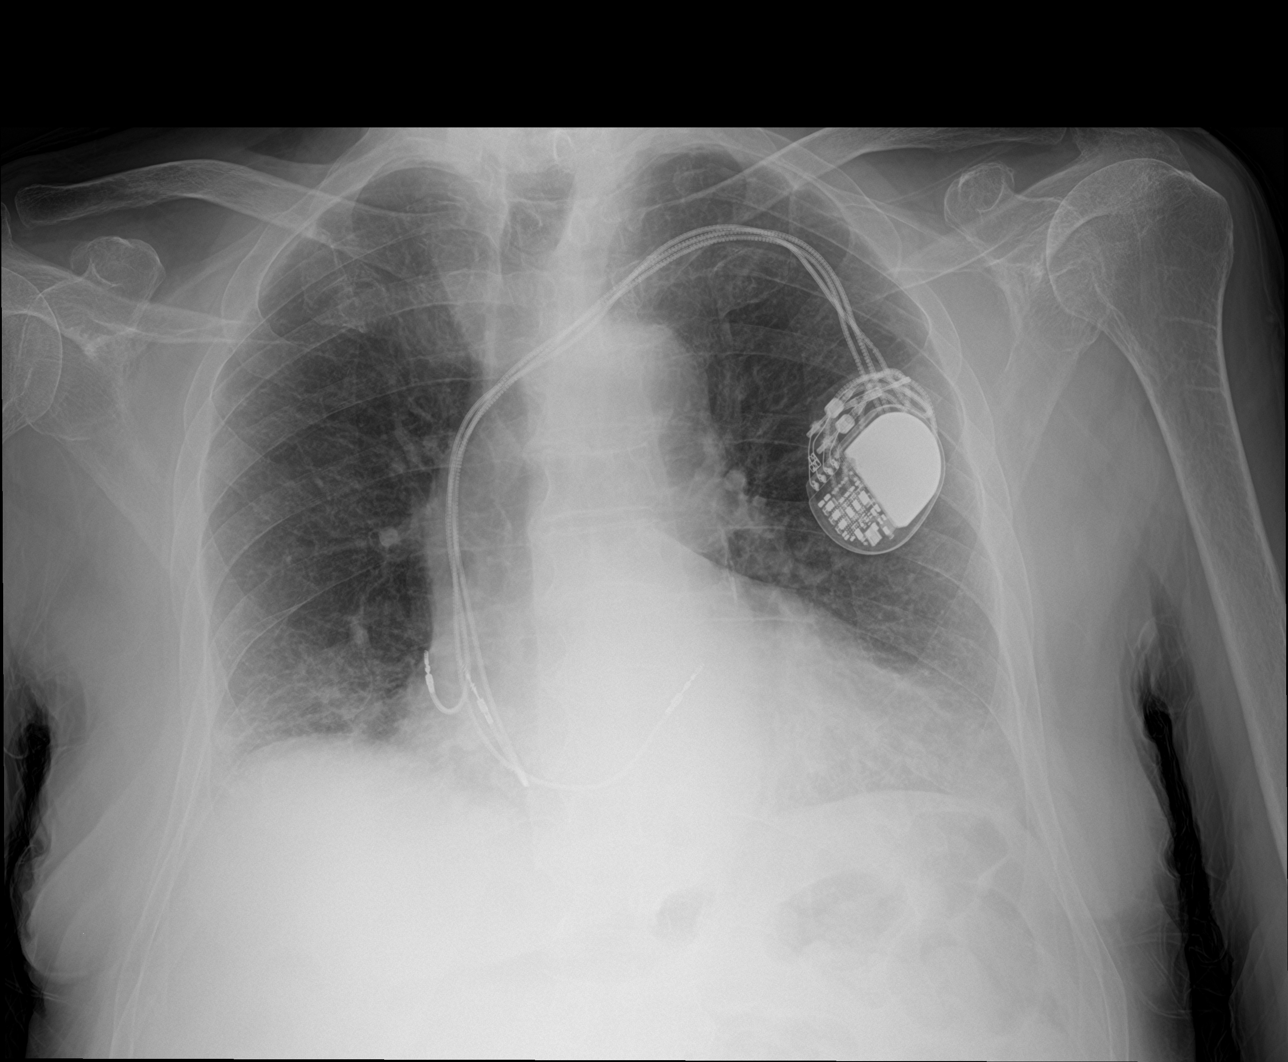

[3 of 3 positions shown; findings below may reference images not displayed]

FINDINGS: Cardiac shadow is mildly enlarged in size. Pacing device is again
noted and stable. The lungs are well aerated bilaterally. No focal
infiltrate or sizable effusion is seen. Chronic compression
deformity is noted in the lower thoracic spine stable from the prior
study.
IMPRESSION: No acute abnormality seen.

## 2018-07-24 IMAGING — DX DG CHEST 1V PORT
1 series · 1 of 1 positions shown · non-contrast
Comparison: 09/10/2016 and 06/17/2014.

CLINICAL DATA: Increase weakness and decreased oral intake for 3
days. Fever. Sepsis.

EXAM:
PORTABLE CHEST 1 VIEW

[chest ap]
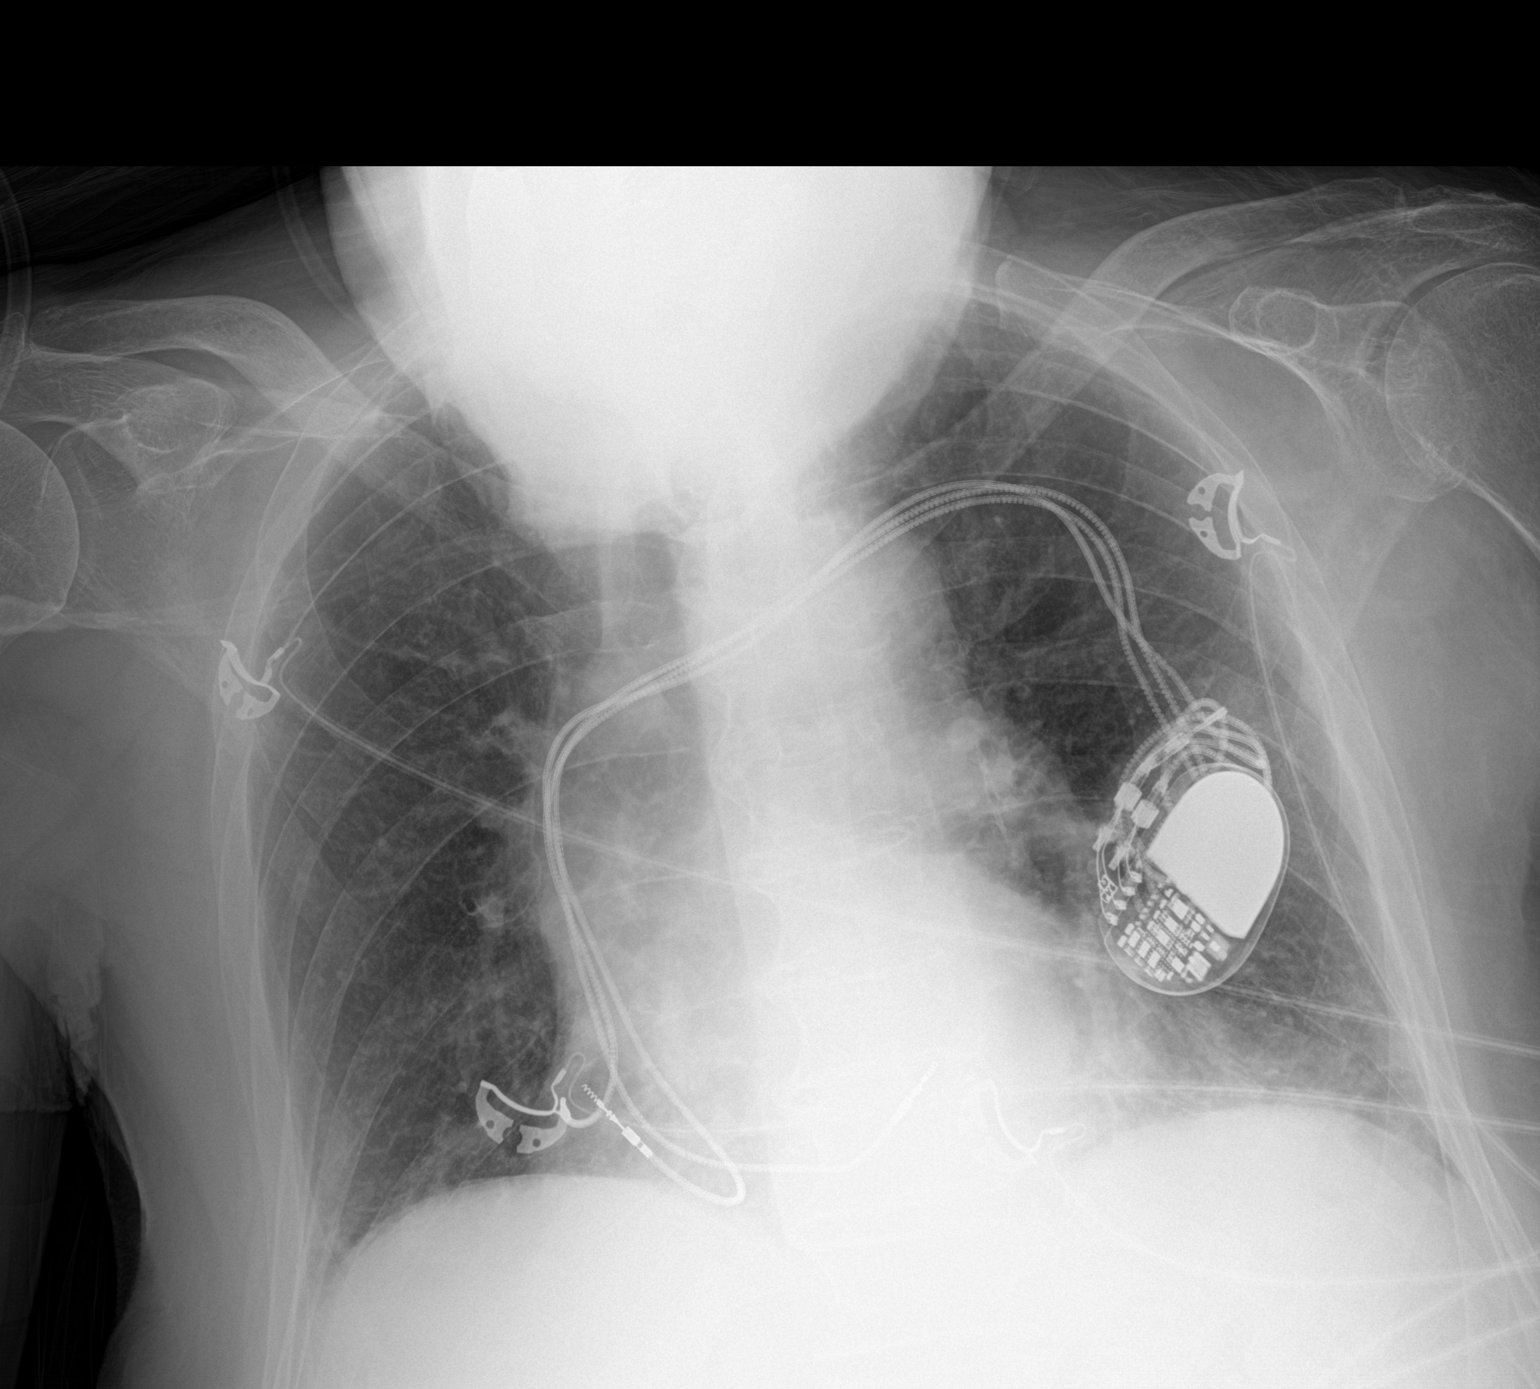

[1 of 1 positions shown; findings below may reference images not displayed]

FINDINGS: 7674 hours. Positioning is limited by the patient's thoracic
kyphosis and dementia. The mandible overlies the lung apices. Left
subclavian pacemaker leads appear unchanged within the right atrium
and right ventricle. The heart size and mediastinal contours are
stable. Mildly increased density has developed at the left lung
base. Right basilar atelectasis or scarring appears unchanged. There
is no pleural effusion or pneumothorax. No acute osseous findings
are seen.
IMPRESSION: Interval mildly increased density at the left lung base, potentially
sequela of aspiration or early pneumonia. No other significant
changes.
